# Patient Record
Sex: Female | Born: 1960 | Race: White | Hispanic: No | Marital: Single | Smoking: Never smoker
Health system: Southern US, Community
[De-identification: ages and names within clinical notes are randomized; demographics above are authoritative.]

## PROBLEM LIST (undated history)

## (undated) DIAGNOSIS — I10 Essential (primary) hypertension: Secondary | ICD-10-CM

## (undated) DIAGNOSIS — J302 Other seasonal allergic rhinitis: Secondary | ICD-10-CM

## (undated) HISTORY — PX: BACK SURGERY: SHX140

---

## 1998-10-14 ENCOUNTER — Encounter: Payer: Self-pay | Admitting: Neurological Surgery

## 1998-10-14 ENCOUNTER — Ambulatory Visit (HOSPITAL_COMMUNITY): Admission: RE | Admit: 1998-10-14 | Discharge: 1998-10-14 | Payer: Self-pay | Admitting: Neurological Surgery

## 1999-09-23 ENCOUNTER — Encounter: Admission: RE | Admit: 1999-09-23 | Discharge: 1999-10-14 | Payer: Self-pay | Admitting: Neurological Surgery

## 1999-10-30 ENCOUNTER — Emergency Department (HOSPITAL_COMMUNITY): Admission: EM | Admit: 1999-10-30 | Discharge: 1999-10-30 | Payer: Self-pay | Admitting: Emergency Medicine

## 1999-11-06 ENCOUNTER — Emergency Department (HOSPITAL_COMMUNITY): Admission: EM | Admit: 1999-11-06 | Discharge: 1999-11-06 | Payer: Self-pay | Admitting: Emergency Medicine

## 1999-11-06 ENCOUNTER — Encounter: Payer: Self-pay | Admitting: Emergency Medicine

## 2000-06-01 ENCOUNTER — Encounter: Admission: RE | Admit: 2000-06-01 | Discharge: 2000-06-01 | Payer: Self-pay | Admitting: Neurosurgery

## 2000-06-01 ENCOUNTER — Encounter: Payer: Self-pay | Admitting: Neurosurgery

## 2000-06-18 ENCOUNTER — Encounter: Admission: RE | Admit: 2000-06-18 | Discharge: 2000-06-18 | Payer: Self-pay | Admitting: Neurological Surgery

## 2000-06-18 ENCOUNTER — Encounter: Payer: Self-pay | Admitting: Neurological Surgery

## 2000-06-29 ENCOUNTER — Encounter: Payer: Self-pay | Admitting: Neurological Surgery

## 2000-06-29 ENCOUNTER — Encounter: Admission: RE | Admit: 2000-06-29 | Discharge: 2000-06-29 | Payer: Self-pay | Admitting: Neurological Surgery

## 2000-08-24 ENCOUNTER — Emergency Department (HOSPITAL_COMMUNITY): Admission: EM | Admit: 2000-08-24 | Discharge: 2000-08-24 | Payer: Self-pay | Admitting: Emergency Medicine

## 2000-08-24 ENCOUNTER — Encounter: Payer: Self-pay | Admitting: Emergency Medicine

## 2000-10-06 ENCOUNTER — Other Ambulatory Visit: Admission: RE | Admit: 2000-10-06 | Discharge: 2000-10-06 | Payer: Self-pay | Admitting: Obstetrics & Gynecology

## 2000-10-07 ENCOUNTER — Encounter: Payer: Self-pay | Admitting: Neurological Surgery

## 2000-10-11 ENCOUNTER — Inpatient Hospital Stay (HOSPITAL_COMMUNITY): Admission: RE | Admit: 2000-10-11 | Discharge: 2000-10-15 | Payer: Self-pay | Admitting: Neurological Surgery

## 2000-10-11 ENCOUNTER — Encounter: Payer: Self-pay | Admitting: Neurological Surgery

## 2000-10-21 ENCOUNTER — Ambulatory Visit (HOSPITAL_COMMUNITY): Admission: RE | Admit: 2000-10-21 | Discharge: 2000-10-21 | Payer: Self-pay | Admitting: Neurological Surgery

## 2000-12-15 ENCOUNTER — Encounter: Payer: Self-pay | Admitting: Neurological Surgery

## 2000-12-15 ENCOUNTER — Encounter: Admission: RE | Admit: 2000-12-15 | Discharge: 2000-12-15 | Payer: Self-pay | Admitting: Neurological Surgery

## 2001-04-15 ENCOUNTER — Encounter: Admission: RE | Admit: 2001-04-15 | Discharge: 2001-04-15 | Payer: Self-pay | Admitting: Neurological Surgery

## 2001-04-15 ENCOUNTER — Encounter: Payer: Self-pay | Admitting: Neurological Surgery

## 2006-11-24 ENCOUNTER — Emergency Department (HOSPITAL_COMMUNITY): Admission: EM | Admit: 2006-11-24 | Discharge: 2006-11-24 | Payer: Self-pay | Admitting: Emergency Medicine

## 2006-12-16 ENCOUNTER — Ambulatory Visit (HOSPITAL_COMMUNITY): Admission: RE | Admit: 2006-12-16 | Discharge: 2006-12-16 | Payer: Self-pay | Admitting: Urology

## 2010-03-21 ENCOUNTER — Ambulatory Visit: Payer: Self-pay | Admitting: Obstetrics & Gynecology

## 2010-03-27 ENCOUNTER — Ambulatory Visit: Payer: Self-pay | Admitting: Advanced Practice Midwife

## 2010-03-27 ENCOUNTER — Ambulatory Visit: Payer: Self-pay | Admitting: Obstetrics & Gynecology

## 2010-03-27 ENCOUNTER — Inpatient Hospital Stay (HOSPITAL_COMMUNITY): Admission: AD | Admit: 2010-03-27 | Discharge: 2010-03-27 | Payer: Self-pay | Admitting: Obstetrics and Gynecology

## 2010-03-28 ENCOUNTER — Ambulatory Visit: Payer: Self-pay | Admitting: Obstetrics & Gynecology

## 2010-03-31 ENCOUNTER — Ambulatory Visit: Payer: Self-pay | Admitting: Obstetrics & Gynecology

## 2010-04-03 ENCOUNTER — Ambulatory Visit: Payer: Self-pay | Admitting: Obstetrics & Gynecology

## 2010-04-08 ENCOUNTER — Ambulatory Visit: Payer: Self-pay | Admitting: Obstetrics & Gynecology

## 2010-04-10 ENCOUNTER — Ambulatory Visit: Payer: Self-pay | Admitting: Obstetrics & Gynecology

## 2010-04-17 ENCOUNTER — Ambulatory Visit (HOSPITAL_COMMUNITY): Admission: RE | Admit: 2010-04-17 | Discharge: 2010-04-17 | Payer: Self-pay | Admitting: Obstetrics & Gynecology

## 2010-04-17 ENCOUNTER — Ambulatory Visit: Payer: Self-pay | Admitting: Obstetrics and Gynecology

## 2010-04-21 ENCOUNTER — Ambulatory Visit: Payer: Self-pay | Admitting: Obstetrics & Gynecology

## 2010-04-24 ENCOUNTER — Encounter: Payer: Self-pay | Admitting: Obstetrics & Gynecology

## 2010-04-24 ENCOUNTER — Ambulatory Visit: Payer: Self-pay | Admitting: Family Medicine

## 2010-04-24 LAB — CONVERTED CEMR LAB
ALT: 9 units/L (ref 0–35)
AST: 12 units/L (ref 0–37)
BUN: 6 mg/dL (ref 6–23)
CO2: 21 meq/L (ref 19–32)
Calcium: 9.3 mg/dL (ref 8.4–10.5)
Creatinine, Ser: 0.55 mg/dL (ref 0.40–1.20)
Glucose, Bld: 75 mg/dL (ref 70–99)
Platelets: 151 10*3/uL (ref 150–400)
RDW: 13.4 % (ref 11.5–15.5)
Sodium: 137 meq/L (ref 135–145)
Total Protein: 6.3 g/dL (ref 6.0–8.3)

## 2010-04-25 ENCOUNTER — Encounter: Payer: Self-pay | Admitting: Obstetrics & Gynecology

## 2010-04-25 LAB — CONVERTED CEMR LAB
Collection Interval-CRCL: 24 hr
Creatinine 24 HR UR: 1189 mg/24hr (ref 700–1800)

## 2010-04-28 ENCOUNTER — Ambulatory Visit: Payer: Self-pay | Admitting: Obstetrics & Gynecology

## 2010-05-01 ENCOUNTER — Encounter (INDEPENDENT_AMBULATORY_CARE_PROVIDER_SITE_OTHER): Payer: Self-pay | Admitting: *Deleted

## 2010-05-01 ENCOUNTER — Ambulatory Visit: Payer: Self-pay | Admitting: Obstetrics & Gynecology

## 2010-05-01 LAB — CONVERTED CEMR LAB
AST: 13 units/L (ref 0–37)
BUN: 4 mg/dL — ABNORMAL LOW (ref 6–23)
Chlamydia, DNA Probe: NEGATIVE
Chloride: 104 meq/L (ref 96–112)
GC Probe Amp, Genital: NEGATIVE
HCT: 39.2 % (ref 36.0–46.0)
MCHC: 33.2 g/dL (ref 30.0–36.0)
Platelets: 163 10*3/uL (ref 150–400)
Potassium: 3.9 meq/L (ref 3.5–5.3)
RBC: 4.06 M/uL (ref 3.87–5.11)
RDW: 13.5 % (ref 11.5–15.5)
Total Bilirubin: 1 mg/dL (ref 0.3–1.2)

## 2010-05-02 ENCOUNTER — Encounter: Payer: Self-pay | Admitting: Obstetrics & Gynecology

## 2010-05-02 ENCOUNTER — Ambulatory Visit: Payer: Self-pay | Admitting: Obstetrics & Gynecology

## 2010-05-02 ENCOUNTER — Encounter (INDEPENDENT_AMBULATORY_CARE_PROVIDER_SITE_OTHER): Payer: Self-pay | Admitting: *Deleted

## 2010-05-02 LAB — CONVERTED CEMR LAB
Collection Interval-CRCL: 24 hr
Creatinine, Urine: 65.5 mg/dL

## 2010-05-05 ENCOUNTER — Ambulatory Visit: Payer: Self-pay | Admitting: Family Medicine

## 2010-05-08 ENCOUNTER — Ambulatory Visit: Payer: Self-pay | Admitting: Family Medicine

## 2010-05-08 ENCOUNTER — Inpatient Hospital Stay (HOSPITAL_COMMUNITY): Admission: RE | Admit: 2010-05-08 | Discharge: 2010-05-12 | Payer: Self-pay | Admitting: Family Medicine

## 2010-05-10 ENCOUNTER — Encounter: Payer: Self-pay | Admitting: Obstetrics & Gynecology

## 2010-05-16 ENCOUNTER — Ambulatory Visit: Payer: Self-pay | Admitting: Obstetrics and Gynecology

## 2010-07-24 ENCOUNTER — Ambulatory Visit: Payer: Self-pay | Admitting: Obstetrics & Gynecology

## 2010-08-22 ENCOUNTER — Ambulatory Visit (HOSPITAL_COMMUNITY): Admission: RE | Admit: 2010-08-22 | Discharge: 2010-08-22 | Payer: Self-pay | Admitting: Family Medicine

## 2010-09-25 ENCOUNTER — Other Ambulatory Visit: Admission: RE | Admit: 2010-09-25 | Discharge: 2010-09-25 | Payer: Self-pay | Admitting: Obstetrics & Gynecology

## 2010-09-25 ENCOUNTER — Ambulatory Visit: Payer: Self-pay | Admitting: Obstetrics and Gynecology

## 2010-10-20 ENCOUNTER — Ambulatory Visit: Payer: Self-pay | Admitting: Obstetrics and Gynecology

## 2010-11-20 ENCOUNTER — Inpatient Hospital Stay (HOSPITAL_COMMUNITY): Admission: AD | Admit: 2010-11-20 | Discharge: 2010-03-22 | Payer: Self-pay | Admitting: Family Medicine

## 2010-12-08 IMAGING — US US OB FOLLOW-UP
1 series · 14 of 28 positions shown · non-contrast
Comparison: none

OBSTETRICAL ULTRASOUND:
 This ultrasound exam was performed in the [HOSPITAL] Ultrasound Department.  The OB US report was generated in the AS system, and faxed to the ordering physician.  This report is also available in [HOSPITAL]?s AccessANYware and in [REDACTED] PACS.

[Series 1: us ob follow up · 0.27mm/px · 44 acquisitions, 14 frames shown]
[im 2/44]
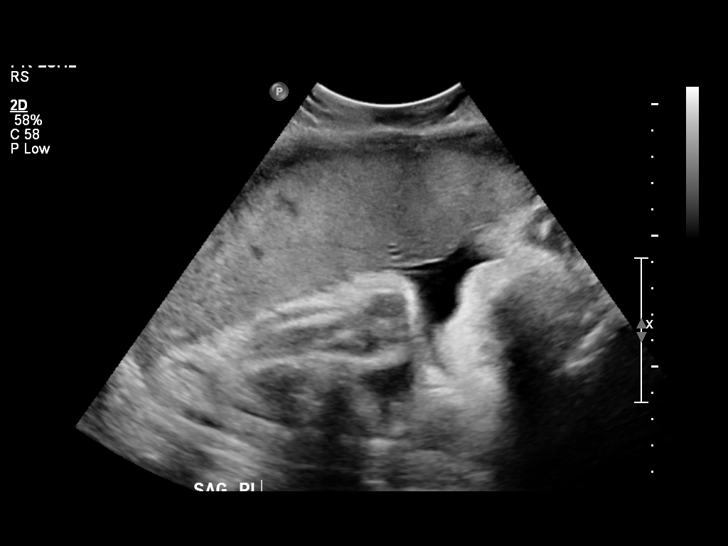
[im 5/44]
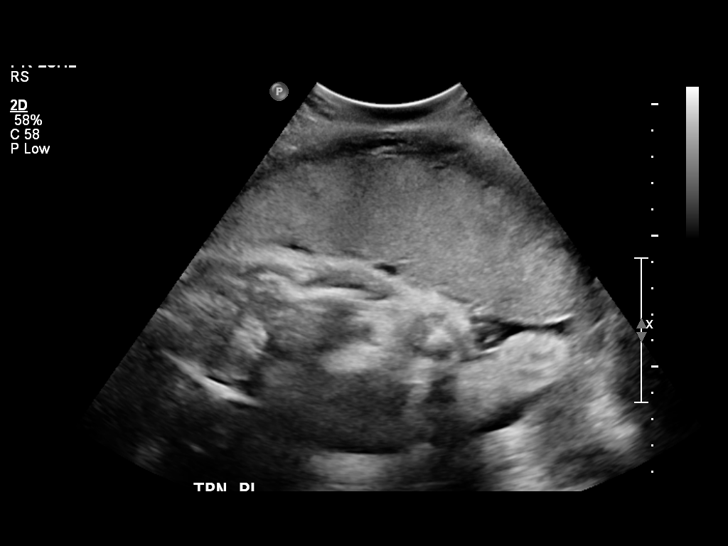
[im 8/44]
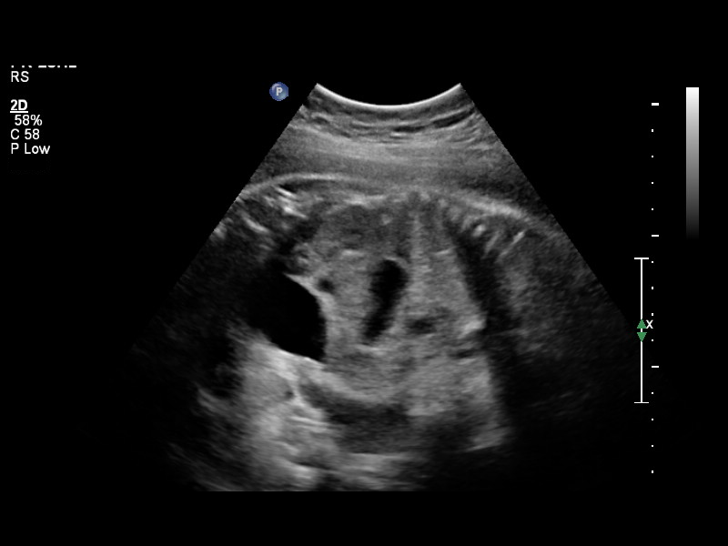
[im 12/44]
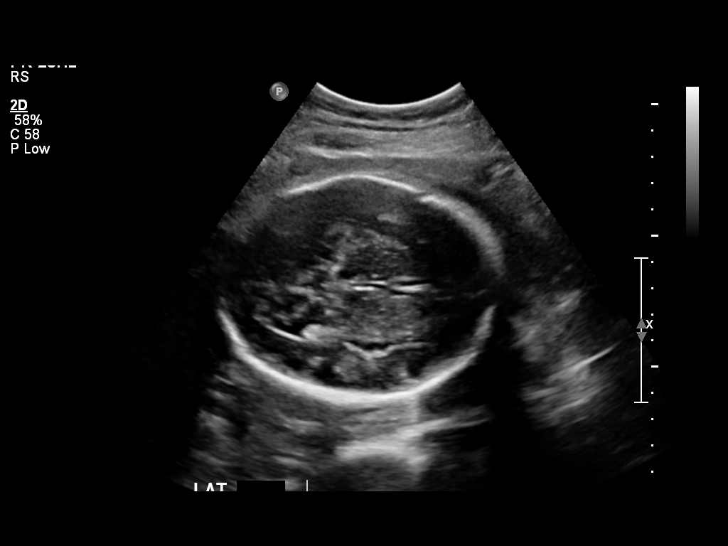
[im 15/44]
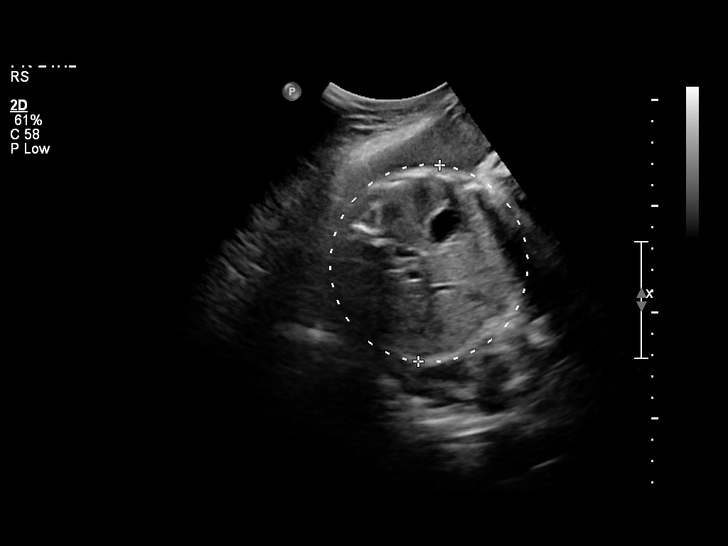
[im 18/44]
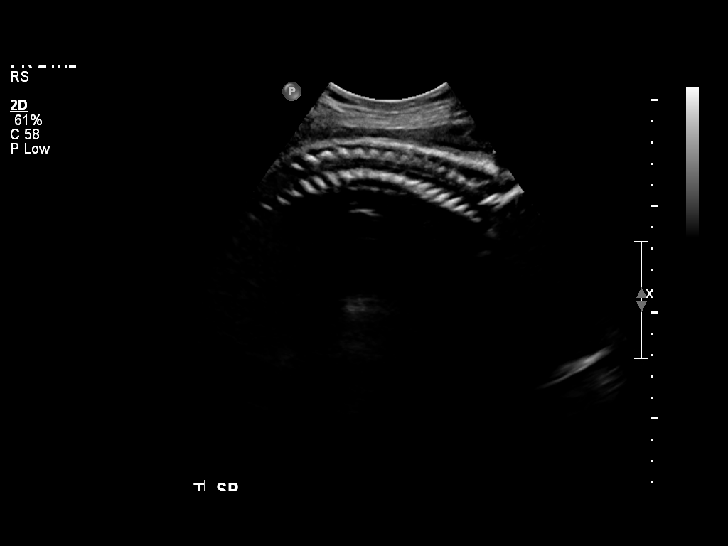
[im 21/44]
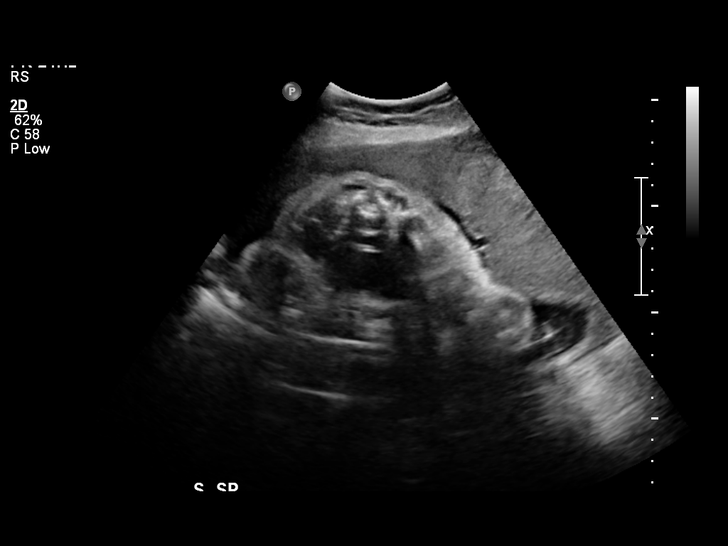
[im 24/44]
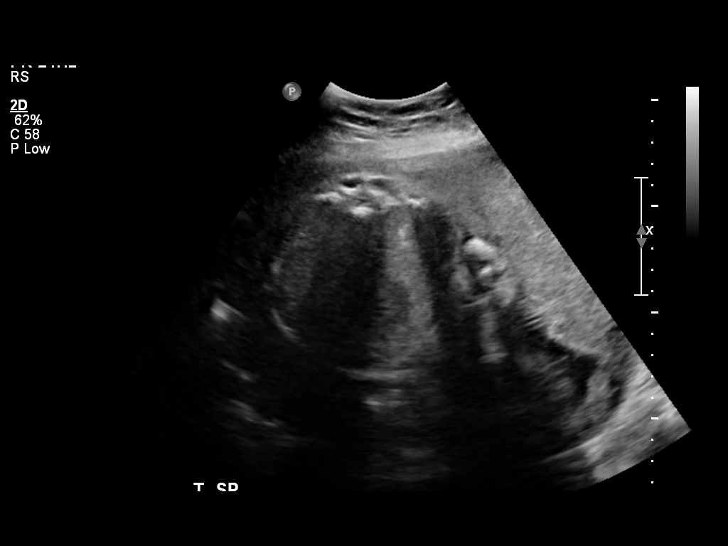
[im 28/44]
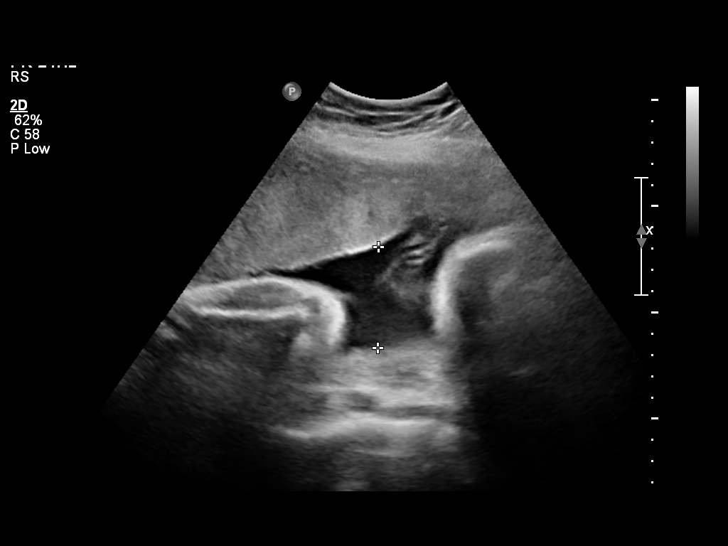
[im 31/44]
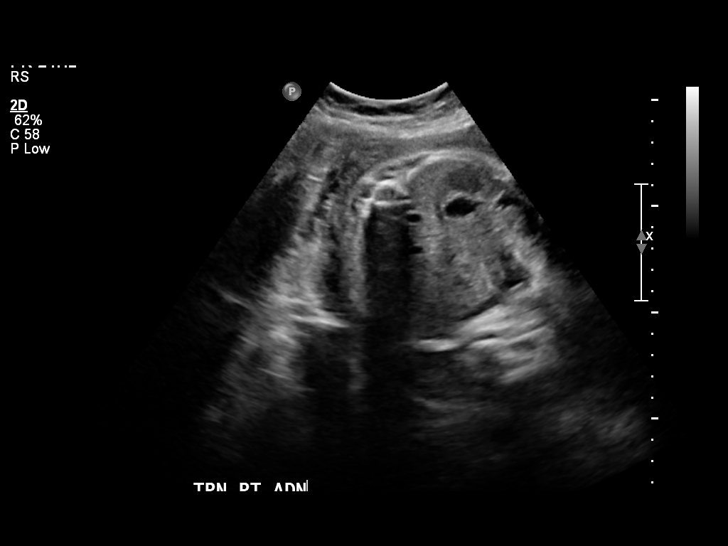
[im 34/44]
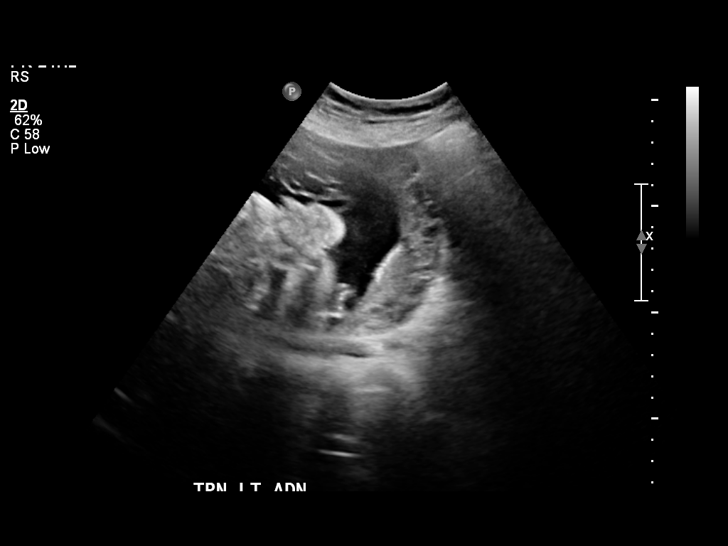
[im 37/44]
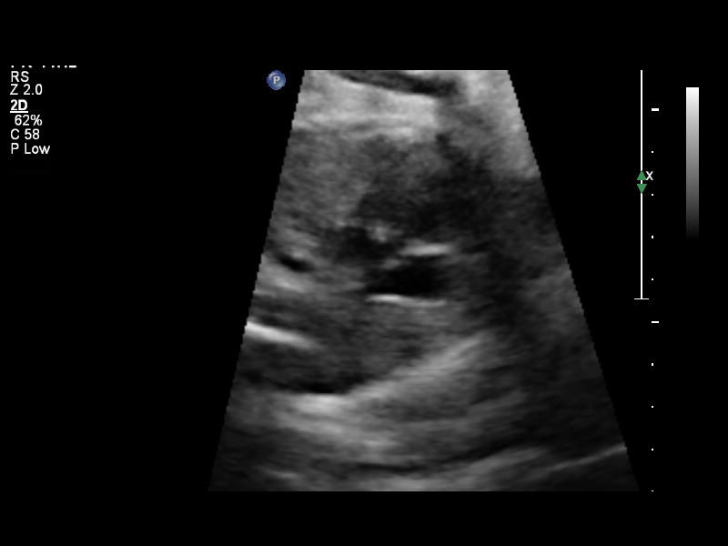
[im 40/44]
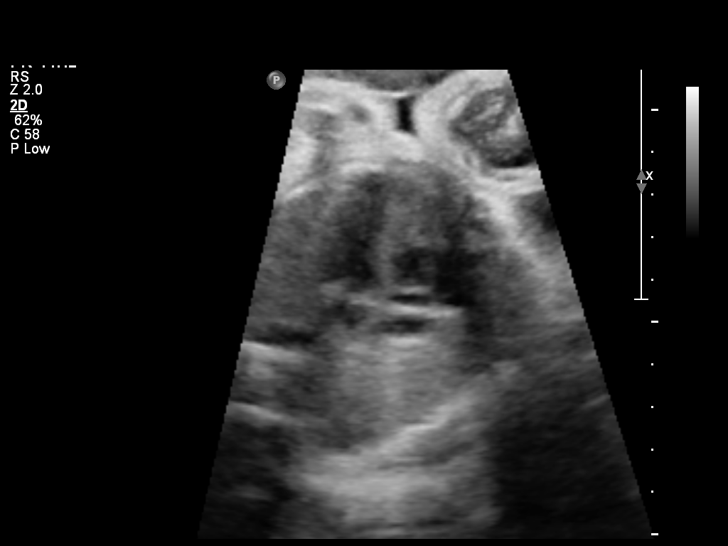
[im 44/44]
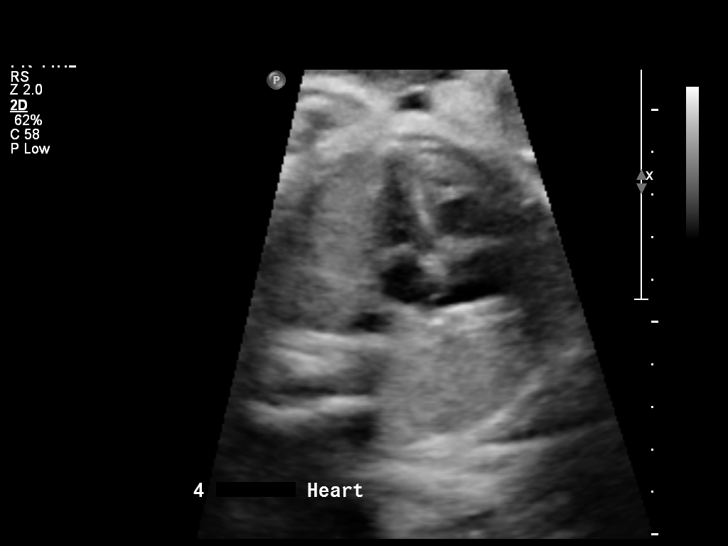

[14 of 28 positions shown; findings below may reference images not displayed]

IMPRESSION: See AS Obstetric US report.

## 2011-03-02 LAB — POCT URINALYSIS DIP (DEVICE)
Hgb urine dipstick: NEGATIVE
Ketones, ur: NEGATIVE mg/dL
Nitrite: NEGATIVE
Protein, ur: 30 mg/dL — AB
Specific Gravity, Urine: 1.01 (ref 1.005–1.030)
Specific Gravity, Urine: 1.02 (ref 1.005–1.030)
Urobilinogen, UA: 0.2 mg/dL (ref 0.0–1.0)
pH: 5.5 (ref 5.0–8.0)

## 2011-03-02 LAB — CBC
HCT: 31.3 % — ABNORMAL LOW (ref 36.0–46.0)
HCT: 36.1 % (ref 36.0–46.0)
Hemoglobin: 11 g/dL — ABNORMAL LOW (ref 12.0–15.0)
Hemoglobin: 12.7 g/dL (ref 12.0–15.0)
MCHC: 35 g/dL (ref 30.0–36.0)
MCV: 100.5 fL — ABNORMAL HIGH (ref 78.0–100.0)
Platelets: 102 10*3/uL — ABNORMAL LOW (ref 150–400)
RBC: 3.11 MIL/uL — ABNORMAL LOW (ref 3.87–5.11)
RBC: 3.23 MIL/uL — ABNORMAL LOW (ref 3.87–5.11)
RBC: 3.66 MIL/uL — ABNORMAL LOW (ref 3.87–5.11)
RDW: 13.3 % (ref 11.5–15.5)
WBC: 8.9 10*3/uL (ref 4.0–10.5)
WBC: 9.8 10*3/uL (ref 4.0–10.5)

## 2011-03-02 LAB — COMPREHENSIVE METABOLIC PANEL
ALT: 10 U/L (ref 0–35)
ALT: 9 U/L (ref 0–35)
AST: 17 U/L (ref 0–37)
AST: 18 U/L (ref 0–37)
Albumin: 2.9 g/dL — ABNORMAL LOW (ref 3.5–5.2)
BUN: 3 mg/dL — ABNORMAL LOW (ref 6–23)
BUN: 5 mg/dL — ABNORMAL LOW (ref 6–23)
CO2: 22 mEq/L (ref 19–32)
CO2: 24 mEq/L (ref 19–32)
Calcium: 9.4 mg/dL (ref 8.4–10.5)
Chloride: 112 mEq/L (ref 96–112)
GFR calc non Af Amer: >60 mL/min (ref >60)
Glucose, Bld: 72 mg/dL (ref 70–99)
Glucose, Bld: 84 mg/dL (ref 70–99)
Potassium: 3.3 mEq/L — ABNORMAL LOW (ref 3.5–5.1)
Potassium: 3.9 mEq/L (ref 3.5–5.1)
Total Bilirubin: 0.8 mg/dL (ref 0.3–1.2)
Total Protein: 5.1 g/dL — ABNORMAL LOW (ref 6.0–8.3)

## 2011-03-03 LAB — POCT URINALYSIS DIP (DEVICE)
Bilirubin Urine: NEGATIVE
Bilirubin Urine: NEGATIVE
Bilirubin Urine: NEGATIVE
Glucose, UA: NEGATIVE mg/dL
Glucose, UA: NEGATIVE mg/dL
Hgb urine dipstick: NEGATIVE
Hgb urine dipstick: NEGATIVE
Ketones, ur: NEGATIVE mg/dL
Ketones, ur: NEGATIVE mg/dL
Nitrite: NEGATIVE
Nitrite: NEGATIVE
Protein, ur: 30 mg/dL — AB
Protein, ur: 30 mg/dL — AB
Specific Gravity, Urine: >=1.03 (ref 1.005–1.030)
Specific Gravity, Urine: 1.015 (ref 1.005–1.030)
Specific Gravity, Urine: >=1.03 (ref 1.005–1.030)
Urobilinogen, UA: 0.2 mg/dL (ref 0.0–1.0)
Urobilinogen, UA: 0.2 mg/dL (ref 0.0–1.0)
pH: 5.5 (ref 5.0–8.0)
pH: 5.5 (ref 5.0–8.0)

## 2011-03-04 LAB — PROTEIN, URINE, 24 HOUR
Collection Interval-UPROT: 24 hours
Protein, 24H Urine: 107 mg/d — ABNORMAL HIGH (ref 50–100)
Urine Total Volume-UPROT: 3550 mL

## 2011-03-04 LAB — COMPREHENSIVE METABOLIC PANEL
ALT: 12 U/L (ref 0–35)
Albumin: 2.8 g/dL — ABNORMAL LOW (ref 3.5–5.2)
Alkaline Phosphatase: 88 U/L (ref 39–117)
Alkaline Phosphatase: 93 U/L (ref 39–117)
BUN: 1 mg/dL — ABNORMAL LOW (ref 6–23)
BUN: 3 mg/dL — ABNORMAL LOW (ref 6–23)
CO2: 25 mEq/L (ref 19–32)
Calcium: 8.3 mg/dL — ABNORMAL LOW (ref 8.4–10.5)
Chloride: 101 mEq/L (ref 96–112)
GFR calc non Af Amer: >60 mL/min (ref >60)
Glucose, Bld: 90 mg/dL (ref 70–99)
Potassium: 2.3 mEq/L — CL (ref 3.5–5.1)
Potassium: 2.9 mEq/L — ABNORMAL LOW (ref 3.5–5.1)
Sodium: 134 mEq/L — ABNORMAL LOW (ref 135–145)
Total Bilirubin: 0.7 mg/dL (ref 0.3–1.2)
Total Protein: 5.9 g/dL — ABNORMAL LOW (ref 6.0–8.3)

## 2011-03-04 LAB — RAPID URINE DRUG SCREEN, HOSP PERFORMED
Amphetamines: NOT DETECTED
Barbiturates: NOT DETECTED
Benzodiazepines: NOT DETECTED

## 2011-03-04 LAB — CBC
HCT: 33.2 % — ABNORMAL LOW (ref 36.0–46.0)
HCT: 34.5 % — ABNORMAL LOW (ref 36.0–46.0)
Hemoglobin: 11.5 g/dL — ABNORMAL LOW (ref 12.0–15.0)
MCHC: 34.3 g/dL (ref 30.0–36.0)
Platelets: 177 10*3/uL (ref 150–400)
RBC: 3.36 MIL/uL — ABNORMAL LOW (ref 3.87–5.11)
RDW: 13.8 % (ref 11.5–15.5)
RDW: 13.8 % (ref 11.5–15.5)

## 2011-03-04 LAB — GC/CHLAMYDIA PROBE AMP, GENITAL
Chlamydia, DNA Probe: NEGATIVE
GC Probe Amp, Genital: NEGATIVE

## 2011-03-04 LAB — STREP B DNA PROBE: Strep Group B Ag: NEGATIVE

## 2011-03-04 LAB — BASIC METABOLIC PANEL
BUN: 1 mg/dL — ABNORMAL LOW (ref 6–23)
CO2: 23 mEq/L (ref 19–32)
Calcium: 9 mg/dL (ref 8.4–10.5)
GFR calc non Af Amer: >60 mL/min (ref >60)
Glucose, Bld: 125 mg/dL — ABNORMAL HIGH (ref 70–99)

## 2011-03-04 LAB — URIC ACID
Uric Acid, Serum: 4.7 mg/dL (ref 2.4–7.0)
Uric Acid, Serum: 4.7 mg/dL (ref 2.4–7.0)

## 2011-03-04 LAB — DIFFERENTIAL
Basophils Relative: 0 % (ref 0–1)
Lymphocytes Relative: 14 % (ref 12–46)
Lymphs Abs: 0.6 10*3/uL — ABNORMAL LOW (ref 0.7–4.0)
Monocytes Absolute: 0.3 10*3/uL (ref 0.1–1.0)
Monocytes Relative: 7 % (ref 3–12)
Neutro Abs: 3.4 10*3/uL (ref 1.7–7.7)

## 2011-03-04 LAB — RUBELLA SCREEN: Rubella: 127.1 IU/mL — ABNORMAL HIGH

## 2011-03-04 LAB — POCT URINALYSIS DIP (DEVICE)
Bilirubin Urine: NEGATIVE
Glucose, UA: NEGATIVE mg/dL
Ketones, ur: NEGATIVE mg/dL
Nitrite: NEGATIVE
pH: 5.5 (ref 5.0–8.0)

## 2011-03-04 LAB — URINALYSIS, ROUTINE W REFLEX MICROSCOPIC
Hgb urine dipstick: NEGATIVE
Nitrite: NEGATIVE
Protein, ur: NEGATIVE mg/dL
pH: 5.5 (ref 5.0–8.0)

## 2011-03-04 LAB — WET PREP, GENITAL

## 2011-03-04 LAB — CREATININE CLEARANCE, URINE, 24 HOUR
Creatinine Clearance: 163 mL/min — ABNORMAL HIGH (ref 75–115)
Creatinine, Urine: 26.4 mg/dL

## 2011-03-04 LAB — HIV ANTIBODY (ROUTINE TESTING W REFLEX): HIV: NONREACTIVE

## 2011-03-04 LAB — TYPE AND SCREEN
ABO/RH(D): B POS
Antibody Screen: NEGATIVE

## 2011-03-04 LAB — RPR: RPR Ser Ql: NONREACTIVE

## 2011-04-22 ENCOUNTER — Ambulatory Visit: Payer: Self-pay | Admitting: Family Medicine

## 2011-05-01 NOTE — Op Note (Signed)
[place]  Patient:    Kimberly Bush, Kimberly Bush                           MRN: [id number] Proc. Date: 10/11/00 Adm. 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  PROCEDURE:  L5 Gill procedure, posterior interbody arthrodesis with Brantigan cages and local autograft, pedicular fixation L5-S1 with Spiral 90 instrumentation and posterolateral arthrodesis with autograft and allograft.  SURGEON:  Stefani Dama, M.D.  FIRST ASSISTANT:  Tanya Nones. Jeral Fruit, M.D.  ANESTHESIA:  General endotracheal.  INDICATIONS:  The patient is a 50 year old individual who has had significant back and bilateral leg pain.  She has had a grade 1 spondylolisthesis that progressed to a grade 2 spondylolisthesis, and she was advised regarding surgery.  DESCRIPTION OF PROCEDURE:  The patient was brought to the operating room supine on the stretcher.  She was turned prone after general endotracheal anesthesia was started, and the bony prominences were appropriately padded and protected.  The back was then shaved, prepped with Duraprep, and draped in a sterile fashion.  A vertical incision was made in the lower portion of the lumbar spine and taken down to the spinous process of the sacrum, L5, and L4 regions.  Dissection was carried out laterally.  The loose L5 laminar arch was identified, and this was then dissected out to the lateral margins, freeing up the facet joints and removing the spondylolisthetic segments as a single piece.  Then the lateral recesses were decompressed, completing the Gill procedure portion, which decompressed the L5 and the S1 nerve roots bilaterally.  The disk space was identified.  The overhang from the spondylolisthesis was also  identified, and an osteotome was used to remove this overhang and enter the disk space widely from both sides.  The disk was noted to be markedly degenerated and with removal of the disk, the space was noted to be somewhat distractable.  An interbody distractor was then placed between the arch of L4 and the arch of the sacrum, and this allowed for opening of the L5-S1 disk space.  Then by using a combination of curettes and rongeurs, the disk space was evacuated totally of its contents such that the anterior longitudinal ligament was identified ventrally.  Brantigan cages were then sized, and the 11 x 11 x 25 mm cage was felt to fit appropriately.  The cage was then tamped into position after being packed with local autograft. Care was taken to protect the L5 and the S1 nerve roots during this entire procedure.  Pedicle entry sites were then chosen after the intertransverse space was cleared at L5-S1.  Pedicle entry sites were chosen at L5.  The lateral edge of the L4-5 facet needed to be trimmed on either side to allow pedicle entry.  Screws 5.75 x 40 mm were placed into the pedicle of L5, 5.75 x 35 mm screws were placed into the pedicle of the sacrum, with the ventral cortex being pierced to allow anchoring of the screw.  Then two 40 mm rods were contoured between these screws and placed down and locked into position. The lateral intertransverse space was then packed with a  combination of autograft and allograft.  The allograft used was _____ bone expander matrix. Once this was completed, the patency of the common dural tube, the L5 nerve roots, and the S1 nerve roots was checked.  Hemostasis in the soft tissues was checked.  Then the lumbodorsal fascia was reapproximated with #1 Vicryl in interrupted fashion, 2-0 Vicryl was used subcutaneously, and subcuticularly 3-0 Vicryl was used to close the subcuticular layer.  The patient tolerated the procedure well and was returned to the recovery  room in stable condition. D:  10/11/00 TD:  10/12/00 Job: [id number]

## 2011-05-01 NOTE — Discharge Summary (Signed)
[place]. [facility]  Patient:    Kimberly Bush, Kimberly Bush                           MRN: [id number] Adm. Date:  [id number] Disc. Date: [id number] Attending:  Jonne Ply                           Discharge Summary  ADMITTING DIAGNOSIS:  L5-S1 spondylolithesis grade 2 with lumbar radiculopathy at L5.  DISCHARGE DIAGNOSIS:  L5-S1 spondylolithesis grade 2 with lumbar radiculopathy at L5.  OPERATION:  Lumbar Gill procedure at L5, posterior interbody arthrodesis L5-S1, supplemental fixation with pedicle screws L5-S1, posterolateral grafting with autograft and allograft on October 11, 2000.  CONDITION ON DISCHARGE:  Improved.  HOSPITAL COURSE:  The patient is a 50 year old individual who has had significant back and bilateral leg pain.  This has been progressively worseniNG despite conservative measures.  She was advised regarding surgical decompression and stabilization which was performed on October 29.  The patients incision has remained clean and dry, and she has been gradually mobilized.  She has had some difficulty with independence of activity but has been improving steadily.  She will be home under the care of her mom and is using oral pain medication in the form of Percocet and is given a prescription for #60, these without refills, Xanax 0.5 mg #40 without refill.  She will be seen in the office in two weeks time.  POSTOPERATIVE LABORATORY STUDIES:  Within normal limits. DD:  10/15/00 TD:  10/15/00 Job: [id number]

## 2011-05-14 ENCOUNTER — Other Ambulatory Visit: Payer: Self-pay | Admitting: Obstetrics and Gynecology

## 2011-05-14 ENCOUNTER — Ambulatory Visit (INDEPENDENT_AMBULATORY_CARE_PROVIDER_SITE_OTHER): Payer: Self-pay | Admitting: Obstetrics and Gynecology

## 2011-05-14 DIAGNOSIS — R87619 Unspecified abnormal cytological findings in specimens from cervix uteri: Secondary | ICD-10-CM

## 2014-06-12 ENCOUNTER — Emergency Department (HOSPITAL_COMMUNITY)
Admission: EM | Admit: 2014-06-12 | Discharge: 2014-06-12 | Disposition: A | Discharge status: Home / Self Care | Payer: Self-pay | Attending: Emergency Medicine | Admitting: Emergency Medicine

## 2014-06-12 ENCOUNTER — Encounter (HOSPITAL_COMMUNITY): Payer: Self-pay | Admitting: Emergency Medicine

## 2014-06-12 DIAGNOSIS — R5381 Other malaise: Secondary | ICD-10-CM | POA: Insufficient documentation

## 2014-06-12 DIAGNOSIS — R5383 Other fatigue: Secondary | ICD-10-CM

## 2014-06-12 DIAGNOSIS — Z792 Long term (current) use of antibiotics: Secondary | ICD-10-CM | POA: Insufficient documentation

## 2014-06-12 DIAGNOSIS — N390 Urinary tract infection, site not specified: Secondary | ICD-10-CM | POA: Insufficient documentation

## 2014-06-12 DIAGNOSIS — E876 Hypokalemia: Secondary | ICD-10-CM | POA: Insufficient documentation

## 2014-06-12 DIAGNOSIS — N898 Other specified noninflammatory disorders of vagina: Secondary | ICD-10-CM | POA: Insufficient documentation

## 2014-06-12 DIAGNOSIS — Z79899 Other long term (current) drug therapy: Secondary | ICD-10-CM | POA: Insufficient documentation

## 2014-06-12 DIAGNOSIS — R6883 Chills (without fever): Secondary | ICD-10-CM | POA: Insufficient documentation

## 2014-06-12 LAB — URINALYSIS, ROUTINE W REFLEX MICROSCOPIC
GLUCOSE, UA: NEGATIVE mg/dL
Ketones, ur: 15 mg/dL — AB
Nitrite: NEGATIVE
Protein, ur: 100 mg/dL — AB
SPECIFIC GRAVITY, URINE: 1.028 (ref 1.005–1.030)
Urobilinogen, UA: 1 mg/dL (ref 0.0–1.0)
pH: 5.5 (ref 5.0–8.0)

## 2014-06-12 LAB — I-STAT CHEM 8, ED
BUN: 7 mg/dL (ref 6–23)
CHLORIDE: 98 meq/L (ref 96–112)
Calcium, Ion: 1.09 mmol/L — ABNORMAL LOW (ref 1.12–1.23)
Creatinine, Ser: 0.6 mg/dL (ref 0.50–1.10)
GLUCOSE: 146 mg/dL — AB (ref 70–99)
HCT: 40 % (ref 36.0–46.0)
HEMOGLOBIN: 13.6 g/dL (ref 12.0–15.0)
Potassium: 2.5 mEq/L — CL (ref 3.7–5.3)
Sodium: 135 mEq/L — ABNORMAL LOW (ref 137–147)
TCO2: 27 mmol/L (ref 0–100)

## 2014-06-12 LAB — URINE MICROSCOPIC-ADD ON

## 2014-06-12 LAB — WET PREP, GENITAL
CLUE CELLS WET PREP: NONE SEEN
TRICH WET PREP: NONE SEEN
YEAST WET PREP: NONE SEEN

## 2014-06-12 MED ORDER — CEPHALEXIN 500 MG PO CAPS: 500.0000 mg | ORAL_CAPSULE | Freq: Three times a day (TID) | ORAL | Status: AC

## 2014-06-12 MED ORDER — POTASSIUM CHLORIDE CRYS ER 20 MEQ PO TBCR: 20.0000 meq | EXTENDED_RELEASE_TABLET | Freq: Two times a day (BID) | ORAL | Status: DC

## 2014-06-12 MED ORDER — CEPHALEXIN 250 MG PO CAPS
500.0000 mg | ORAL_CAPSULE | Freq: Once | ORAL | Status: AC
  Administered 2014-06-12: 500 mg via ORAL
  Filled 2014-06-12: qty 2

## 2014-06-12 NOTE — ED Notes (Signed)
Pt presents to department for evaluation of vaginal discharge, lower back pain, chills and generalized weakness. Ongoing x5 days. States she is sexually active with x1 partner. Also states dysuria. Pt is alert and oriented x4. NAD.

## 2014-06-12 NOTE — ED Provider Notes (Signed)
CSN: [id number]     Arrival date & time 06/12/14  1108 History   None    Chief Complaint  Patient presents with  . Vaginal Discharge  . Back Pain  . Generalized Body Aches  . Chills     (Consider location/radiation/quality/duration/timing/severity/associated sxs/prior Treatment) Patient is a 53 y.o. female presenting with vaginal discharge.  Vaginal Discharge Quality:  Yellow and thick Severity:  Mild Onset quality:  Gradual Duration:  4 days Timing:  Constant Chronicity:  New Relieved by:  Nothing Ineffective treatments: OTC anti fungal. Associated symptoms: dysuria   Associated symptoms: no fever   Risk factors: unprotected sex   Risk factors: no foreign body, no new sexual partner, no PID, no STI and no STI exposure     History reviewed. No pertinent past medical history. History reviewed. No pertinent past surgical history. No family history on file. History  Substance Use Topics  . Smoking status: Never Smoker   . Smokeless tobacco: Not on file  . Alcohol Use: Yes     Comment: social   OB History   Grav Para Term Preterm Abortions TAB SAB Ect Mult Living                 Review of Systems  Constitutional: Positive for chills and fatigue. Negative for fever.  HENT: Negative for congestion, postnasal drip and rhinorrhea.   Eyes: Negative for photophobia and pain.  Respiratory: Negative for cough and shortness of breath.   Cardiovascular: Negative for chest pain.  Endocrine: Negative for polydipsia and polyuria.  Genitourinary: Positive for dysuria and vaginal discharge. Negative for flank pain and difficulty urinating.  Musculoskeletal: Positive for back pain. Negative for myalgias and neck stiffness.  Skin: Negative for color change and rash.  Neurological: Negative for dizziness and headaches.  Hematological: Negative for adenopathy.  Psychiatric/Behavioral: Negative for confusion.      Allergies  Prednisone  Home Medications   Prior to Admission  medications   Medication Sig Start Date End Date Taking? Authorizing Zong Mcquarrie  Acetaminophen (TYLENOL PO) Take 2 tablets by mouth daily as needed (for headache).   Yes Historical Domingos Riggi, MD  Miconazole Nitrate (MONISTAT 7 VA) Place 1 each vaginally daily at 6 PM. 06/08/14 06/14/14 Yes Historical Khushbu Pippen, MD  cephALEXin (KEFLEX) 500 MG capsule Take 1 capsule (500 mg total) by mouth 3 (three) times daily. 06/12/14 06/19/14  Marily MemosJason Mesner, MD  potassium chloride SA (K-DUR,KLOR-CON) 20 MEQ tablet Take 1 tablet (20 mEq total) by mouth 2 (two) times daily. 06/12/14 06/19/14  Barbara CowerJason Mesner, MD   BP 130/69  Pulse 85  Temp(Src) 98.2 F (36.8 C) (Oral)  Resp 16  SpO2 95% Physical Exam  Nursing note and vitals reviewed. Constitutional: She is oriented to person, place, and time. She appears well-developed and well-nourished.  HENT:  Head: Normocephalic and atraumatic.  Eyes: Conjunctivae and EOM are normal. Right eye exhibits no discharge. Left eye exhibits no discharge.  Cardiovascular: Normal rate and regular rhythm.   Pulmonary/Chest: Effort normal and breath sounds normal. No respiratory distress.  Abdominal: Soft. She exhibits no distension. There is tenderness (suprapubic ). There is no rebound.  Musculoskeletal: Normal range of motion. She exhibits no edema and no tenderness.  Neurological: She is alert and oriented to person, place, and time.  Skin: Skin is warm and dry.    ED Course  Procedures (including critical care time) Labs Review Labs Reviewed  WET PREP, GENITAL - Abnormal; Notable for the following:    WBC, Wet  Prep HPF POC MODERATE (*)    All other components within normal limits  URINALYSIS, ROUTINE W REFLEX MICROSCOPIC - Abnormal; Notable for the following:    Color, Urine AMBER (*)    APPearance CLOUDY (*)    Hgb urine dipstick LARGE (*)    Bilirubin Urine SMALL (*)    Ketones, ur 15 (*)    Protein, ur 100 (*)    Leukocytes, UA MODERATE (*)    All other components within  normal limits  URINE MICROSCOPIC-ADD ON - Abnormal; Notable for the following:    Squamous Epithelial / LPF FEW (*)    Bacteria, UA MANY (*)    All other components within normal limits  I-STAT CHEM 8, ED - Abnormal; Notable for the following:    Sodium 135 (*)    Potassium 2.5 (*)    Glucose, Bld 146 (*)    Calcium, Ion 1.09 (*)    All other components within normal limits  GC/CHLAMYDIA PROBE AMP    Imaging Review No results found.   EKG Interpretation None      MDM   Final diagnoses:  UTI (lower urinary tract infection)  Hypokalemia    53 yo F w/ h/o back surgery here with a few days of malaise, back pain, weakness, muscle aches and chills and yellow thick vaginal discharge. Has been using anti fungal treatments from the drug store without relief. Thinks back pain may be related to helping someone move this weekend. Sexually active with one person for last 5.5 years and feels they are faithful to her. On exam, no cva ttp, mild suprapubic ttp, no distended bladder, pelvic exam with large thin yellow discharge without odor, mild cervicitis.  Will get urine sample and chem 8 to eval for cystitis and pyelonephritis although she appears pretty comfortable without any nausea or fevers, so is unlikely.  Will get cultures and wet prep to eval for std's. Pt certain no exposure possible, so will forego ppx treatment for now and treat if trich or vaginosis noticeable.   Urine with UTI. Will tx with keflex.  No yeast/trich/bv on wet prep.  Chem 8 with low potassium, rhythm strip normal. Pt asymptomatic from that stand point. Will have her take a weeks worth of po K and fu repeat K with pcp.  D/c home and to fu w/ pcp in one week  Marily MemosJason Mesner, MD 06/13/14 0145

## 2014-06-13 LAB — GC/CHLAMYDIA PROBE AMP
CT PROBE, AMP APTIMA: NEGATIVE
GC Probe RNA: NEGATIVE

## 2014-06-13 NOTE — ED Provider Notes (Signed)
Medical screening examination/treatment/procedure(s) were conducted as a shared visit with resident physician and myself.  I personally evaluated the patient during the encounter.   I interviewed and examined the patient. Lungs are CTAB. Cardiac exam wnl. Abdomen soft.  Found to have UTI. Resident performed the pelvic. Will tx w/ Abx and also Potassium d/t low K. Rec f/u w /pcp.   Junius ArgyleForrest S Harrison, MD 06/13/14 [phone]

## 2018-07-01 ENCOUNTER — Ambulatory Visit (HOSPITAL_COMMUNITY)
Admission: EM | Admit: 2018-07-01 | Discharge: 2018-07-01 | Disposition: A | Discharge status: Home / Self Care | Payer: Self-pay | Attending: Internal Medicine | Admitting: Internal Medicine

## 2018-07-01 ENCOUNTER — Encounter (HOSPITAL_COMMUNITY): Payer: Self-pay

## 2018-07-01 DIAGNOSIS — R1110 Vomiting, unspecified: Secondary | ICD-10-CM

## 2018-07-01 DIAGNOSIS — Z888 Allergy status to other drugs, medicaments and biological substances status: Secondary | ICD-10-CM | POA: Insufficient documentation

## 2018-07-01 DIAGNOSIS — R51 Headache: Secondary | ICD-10-CM

## 2018-07-01 DIAGNOSIS — R109 Unspecified abdominal pain: Secondary | ICD-10-CM

## 2018-07-01 DIAGNOSIS — Z79899 Other long term (current) drug therapy: Secondary | ICD-10-CM | POA: Insufficient documentation

## 2018-07-01 DIAGNOSIS — N10 Acute tubulo-interstitial nephritis: Secondary | ICD-10-CM | POA: Insufficient documentation

## 2018-07-01 LAB — POCT URINALYSIS DIP (DEVICE)
Glucose, UA: NEGATIVE mg/dL
Ketones, ur: 15 mg/dL — AB
Leukocytes, UA: NEGATIVE
Nitrite: POSITIVE — AB
PROTEIN: 100 mg/dL — AB
Specific Gravity, Urine: >=1.03 (ref 1.005–1.030)
Urobilinogen, UA: 0.2 mg/dL (ref 0.0–1.0)
pH: 5.5 (ref 5.0–8.0)

## 2018-07-01 MED ORDER — ONDANSETRON HCL 4 MG PO TABS: 8.0000 mg | ORAL_TABLET | ORAL | 0 refills | Status: DC | PRN

## 2018-07-01 MED ORDER — ONDANSETRON HCL 4 MG/2ML IJ SOLN
INTRAMUSCULAR | Status: AC
  Filled 2018-07-01: qty 2

## 2018-07-01 MED ORDER — KETOROLAC TROMETHAMINE 30 MG/ML IJ SOLN
INTRAMUSCULAR | Status: AC
  Filled 2018-07-01: qty 1

## 2018-07-01 MED ORDER — SODIUM CHLORIDE 0.9 % IV BOLUS
1000.0000 mL | Freq: Once | INTRAVENOUS | Status: AC
  Administered 2018-07-01: 1000 mL via INTRAVENOUS

## 2018-07-01 MED ORDER — LIDOCAINE HCL (PF) 1 % IJ SOLN
INTRAMUSCULAR | Status: AC
  Filled 2018-07-01: qty 2

## 2018-07-01 MED ORDER — KETOROLAC TROMETHAMINE 30 MG/ML IJ SOLN
30.0000 mg | Freq: Once | INTRAMUSCULAR | Status: AC
  Administered 2018-07-01: 30 mg via INTRAVENOUS

## 2018-07-01 MED ORDER — ONDANSETRON HCL 4 MG/2ML IJ SOLN
4.0000 mg | Freq: Once | INTRAMUSCULAR | Status: AC
  Administered 2018-07-01: 4 mg via INTRAVENOUS

## 2018-07-01 MED ORDER — KETOROLAC TROMETHAMINE 60 MG/2ML IM SOLN: 60.0000 mg | Freq: Once | INTRAMUSCULAR | Status: DC

## 2018-07-01 MED ORDER — CEFTRIAXONE SODIUM 1 G IJ SOLR
INTRAMUSCULAR | Status: AC
  Filled 2018-07-01: qty 10

## 2018-07-01 MED ORDER — CEFTRIAXONE SODIUM 1 G IJ SOLR
1.0000 g | Freq: Once | INTRAMUSCULAR | Status: AC
  Administered 2018-07-01: 1 g via INTRAMUSCULAR

## 2018-07-01 MED ORDER — ONDANSETRON HCL 4 MG/2ML IJ SOLN: 4.0000 mg | Freq: Once | INTRAMUSCULAR | Status: DC

## 2018-07-01 MED ORDER — CEPHALEXIN 500 MG PO CAPS: 500.0000 mg | ORAL_CAPSULE | Freq: Two times a day (BID) | ORAL | 0 refills | Status: AC

## 2018-07-01 NOTE — Discharge Instructions (Signed)
History and exam today suggested pyelonephritis, a kidney infection.  Urine culture is pending; the urgent care will contact you if a change in treatment is needed.  Injection of Rocephin (antibiotic), ondansetron (for nausea), ketorolac (anti-inflammatory/pain reliever) were given at the urgent care today.  2 L of IV fluids were also given.  Rest and push fluids.  Prescriptions for cephalexin, an antibiotic, and ondansetron, for nausea, were sent to the pharmacy.  Anticipate gradual improvement in well-being, fever, vomiting, and abdominal/flank pain over the next several days.

## 2018-07-01 NOTE — ED Triage Notes (Signed)
Pt presents with abdominal pain, fever, headaches, chills and vomiting

## 2018-07-01 NOTE — ED Provider Notes (Signed)
[facility]    CSN: [id number] Arrival date & time: 07/01/18  1113     History   Chief Complaint Chief Complaint  Patient presents with  . Abdominal Pain    headaches, fever, chills , vomiting    HPI Kimberly Bush is a 57 y.o. female.   Denies significant past medical history.  Presents today with one-week history of left flank/left lower quadrant pain, little bit of urinary frequency with small volumes.  Thought she had something viral, but symptoms in the last 3 or 4 days have worsened instead of resolving with headache, chills, achiness.  She had emesis x2 today.  No real change in bowel habits, maybe a little looser/darker than usual.  History of IBS.  No cough, no runny/congested nose, no sore throat.  No rash. History of allergies.    HPI  History reviewed. No pertinent past medical history.  History reviewed. No pertinent surgical history.    Home Medications    Prior to Admission medications   Medication Sig Start Date End Date Taking? Authorizing Provider  Acetaminophen (TYLENOL PO) Take 2 tablets by mouth daily as needed (for headache).    [provider]  cephALEXin (KEFLEX) 500 MG capsule Take 1 capsule (500 mg total) by mouth 2 (two) times daily for 7 days. 07/01/18 07/08/18  Isa Rankin, MD  ondansetron (ZOFRAN) 4 MG tablet Take 2 tablets (8 mg total) by mouth every 4 (four) hours as needed for nausea or vomiting. 07/01/18   Isa Rankin, MD  potassium chloride SA (K-DUR,KLOR-CON) 20 MEQ tablet Take 1 tablet (20 mEq total) by mouth 2 (two) times daily. 06/12/14 06/19/14  Mesner, Barbara Cower, MD    Family History History reviewed. No pertinent family history.  Social History Social History   Tobacco Use  . Smoking status: Never Smoker  Substance Use Topics  . Alcohol use: Yes    Comment: social  . Drug use: No     Allergies   Prednisone   Review of Systems Review of Systems  All other systems reviewed and are  negative.    Physical Exam Triage Vital Signs ED Triage Vitals  Enc Vitals Group     BP 07/01/18 1132 (!) 134/47     Pulse Rate 07/01/18 1132 91     Resp 07/01/18 1132 20     Temp 07/01/18 1132 99.9 F (37.7 C)     Temp Source 07/01/18 1132 Temporal     SpO2 07/01/18 1132 97 %     Weight --      Height --      Pain Score 07/01/18 1131 7     Pain Loc --    Updated Vital Signs BP (!) 134/47 (BP Location: Left Arm)   Pulse 95   Temp 99.1 F (37.3 C)   Resp 18   LMP  (LMP Unknown)   SpO2 100%  Physical Exam  Constitutional: She is oriented to person, place, and time. No distress.  Alert, lying down on exam table, but able to sit up for exam.  Looks ill but not toxic  HENT:  Head: Atraumatic.  Eyes:  Conjugate gaze, no eye redness/drainage  Neck: Neck supple.  Cardiovascular: Normal rate and regular rhythm.  Pulmonary/Chest: No respiratory distress. She has no wheezes. She has no rales.  Lungs clear, symmetric breath sounds  Abdominal: Soft. She exhibits no distension. There is no rebound and no guarding.  Mild tenderness to deep palpation across the lower abdomen, nonfocal.  Patient does not guard  Musculoskeletal: Normal range of motion.  No leg swelling  Neurological: She is alert and oriented to person, place, and time.  Skin: Skin is warm and dry.  No cyanosis  Nursing note and vitals reviewed.    UC Treatments / Results  Labs Results for orders placed or performed during the hospital encounter of 07/01/18  Urine culture  Result Value Ref Range   Specimen Description URINE, RANDOM    Special Requests      NONE Performed at [facility], [address] [place], [place] [address]    Culture (A)     >=100,000 COLONIES/mL ESCHERICHIA COLI >=100,000 COLONIES/mL DIPHTHEROIDS(CORYNEBACTERIUM SPECIES)    Report Status 07/03/2018 FINAL    Organism ID, Bacteria ESCHERICHIA COLI (A)       Susceptibility   Escherichia coli - MIC*    AMPICILLIN >=32  RESISTANT Resistant     CEFAZOLIN <=4 SENSITIVE Sensitive     CEFTRIAXONE <=1 SENSITIVE Sensitive     CIPROFLOXACIN <=0.25 SENSITIVE Sensitive     GENTAMICIN <=1 SENSITIVE Sensitive     IMIPENEM <=0.25 SENSITIVE Sensitive     NITROFURANTOIN <=16 SENSITIVE Sensitive     TRIMETH/SULFA >=320 RESISTANT Resistant     AMPICILLIN/SULBACTAM 16 INTERMEDIATE Intermediate     PIP/TAZO <=4 SENSITIVE Sensitive     Extended ESBL NEGATIVE Sensitive     * >=100,000 COLONIES/mL ESCHERICHIA COLI  POCT urinalysis dip (device)  Result Value Ref Range   Glucose, UA NEGATIVE NEGATIVE mg/dL   Bilirubin Urine SMALL (A) NEGATIVE   Ketones, ur 15 (A) NEGATIVE mg/dL   Specific Gravity, Urine >=1.030 1.005 - 1.030   Hgb urine dipstick MODERATE (A) NEGATIVE   pH 5.5 5.0 - 8.0   Protein, ur 100 (A) NEGATIVE mg/dL   Urobilinogen, UA 0.2 0.0 - 1.0 mg/dL   Nitrite POSITIVE (A) NEGATIVE   Leukocytes, UA NEGATIVE NEGATIVE    Procedures Procedures (including critical care time)  Medications Ordered in UC Medications  cefTRIAXone (ROCEPHIN) injection 1 g (1 g Intramuscular Given 07/01/18 1247)  sodium chloride 0.9 % bolus 1,000 mL (0 mLs Intravenous Stopped 07/01/18 1348)  ondansetron (ZOFRAN) injection 4 mg (4 mg Intravenous Given 07/01/18 1247)  ketorolac (TORADOL) 30 MG/ML injection 30 mg (30 mg Intravenous Given 07/01/18 1247)  sodium chloride 0.9 % bolus 1,000 mL (0 mLs Intravenous Stopped 07/01/18 1545)    Final Clinical Impressions(s) / UC Diagnoses   Final diagnoses:  Acute pyelonephritis     Discharge Instructions     History and exam today suggested pyelonephritis, a kidney infection.  Urine culture is pending; the urgent care will contact you if a change in treatment is needed.  Injection of Rocephin (antibiotic), ondansetron (for nausea), ketorolac (anti-inflammatory/pain reliever) were given at the urgent care today.  2 L of IV fluids were also given.  Rest and push fluids.  Prescriptions for  cephalexin, an antibiotic, and ondansetron, for nausea, were sent to the pharmacy.  Anticipate gradual improvement in well-being, fever, vomiting, and abdominal/flank pain over the next several days.    ED Prescriptions    Medication Sig Dispense Auth. Provider   cephALEXin (KEFLEX) 500 MG capsule Take 1 capsule (500 mg total) by mouth 2 (two) times daily for 7 days. 14 capsule Isa RankinMurray, Hallie Ertl Wilson, MD   ondansetron (ZOFRAN) 4 MG tablet Take 2 tablets (8 mg total) by mouth every 4 (four) hours as needed for nausea or vomiting. 20 tablet Isa RankinMurray, Harrol Novello Wilson, MD  Isa Rankin, MD 07/04/18 2107

## 2018-07-03 LAB — URINE CULTURE: Culture: >=100000 — AB

## 2018-10-18 ENCOUNTER — Encounter (HOSPITAL_COMMUNITY): Payer: Self-pay | Admitting: Emergency Medicine

## 2018-10-18 ENCOUNTER — Ambulatory Visit (HOSPITAL_COMMUNITY)
Admission: EM | Admit: 2018-10-18 | Discharge: 2018-10-18 | Disposition: A | Discharge status: Home / Self Care | Payer: Self-pay | Attending: Family Medicine | Admitting: Family Medicine

## 2018-10-18 ENCOUNTER — Other Ambulatory Visit: Payer: Self-pay

## 2018-10-18 DIAGNOSIS — R011 Cardiac murmur, unspecified: Secondary | ICD-10-CM

## 2018-10-18 DIAGNOSIS — R030 Elevated blood-pressure reading, without diagnosis of hypertension: Secondary | ICD-10-CM

## 2018-10-18 LAB — POCT I-STAT, CHEM 8
BUN: 7 mg/dL (ref 6–20)
CREATININE: 0.6 mg/dL (ref 0.44–1.00)
Calcium, Ion: 1.16 mmol/L (ref 1.15–1.40)
Chloride: 101 mmol/L (ref 98–111)
Glucose, Bld: 91 mg/dL (ref 70–99)
HCT: 46 % (ref 36.0–46.0)
Hemoglobin: 15.6 g/dL — ABNORMAL HIGH (ref 12.0–15.0)
Potassium: 4.4 mmol/L (ref 3.5–5.1)
Sodium: 138 mmol/L (ref 135–145)
TCO2: 31 mmol/L (ref 22–32)

## 2018-10-18 MED ORDER — LISINOPRIL 10 MG PO TABS: 10.0000 mg | ORAL_TABLET | Freq: Every day | ORAL | 2 refills | Status: DC

## 2018-10-18 NOTE — Discharge Instructions (Signed)
Blood work was normal EKG reviewed with Dr. Milus Glazier without acute changes We will start you on blood pressure medication today.   Please continue to monitor blood pressure at home and keep a log Eat a well balanced diet of fruits, vegetables and lean meats.  Avoid foods high in fat and salt Drink water.  At least half your body weight in ounces Exercise for at least 30 minutes daily Please follow up with PCP or cardiologist for further evaluation and management of heart murmur and elevated blood pressure Return or go to the ED if you have any new or worsening symptoms such as vision changes, fatigue, dizziness, chest pain, shortness of breath, nausea, swelling in your hands or feet, urinary symptoms, etc..Marland Kitchen

## 2018-10-18 NOTE — ED Triage Notes (Addendum)
187/112 at [facility], 177/ ?.  Was the repeat blood pressure reading. Non-specific, "just not normal".

## 2018-10-18 NOTE — ED Provider Notes (Signed)
[facility]   [id number] 10/18/18 Arrival Time: 1409  CC: HBP  SUBJECTIVE:  Kimberly Bush is a 57 y.o. female who presents with elevated blood pressure reading.  Checked BP earlier at [facility] was 182/112, 192/105 in office.  Denies hx of HTN.  Does not have a PCP. Complains of mild fatigue for the past week or 2, but otherwise asymptomatic. Denies HA, vision changes, dizziness, lightheadedness, chest pain, shortness of breath, numbness or tingling in extremities, abdominal pain, changes in bowel or bladder habits.    ROS: As per HPI.  Hx of HTN in mother at young age.    History reviewed. No pertinent past medical history. Past Surgical History:  Procedure Laterality Date  . BACK SURGERY     Allergies  Allergen Reactions  . Prednisone     Over heating and bleeding   No current facility-administered medications on file prior to encounter.    Current Outpatient Medications on File Prior to Encounter  Medication Sig Dispense Refill  . potassium chloride SA (K-DUR,KLOR-CON) 20 MEQ tablet Take 1 tablet (20 mEq total) by mouth 2 (two) times daily. 14 tablet 0    Social History   Socioeconomic History  . Marital status: Single    Spouse name: Not on file  . Number of children: Not on file  . Years of education: Not on file  . Highest education level: Not on file  Occupational History  . Not on file  Social Needs  . Financial resource strain: Not on file  . Food insecurity:    Worry: Not on file    Inability: Not on file  . Transportation needs:    Medical: Not on file    Non-medical: Not on file  Tobacco Use  . Smoking status: Never Smoker  Substance and Sexual Activity  . Alcohol use: Yes    Comment: social  . Drug use: No  . Sexual activity: Not on file  Lifestyle  . Physical activity:    Days per week: Not on file    Minutes per session: Not on file  . Stress: Not on file  Relationships  . Social connections:    Talks on phone: Not on file    Gets  together: Not on file    Attends religious service: Not on file    Active member of club or organization: Not on file    Attends meetings of clubs or organizations: Not on file    Relationship status: Not on file  . Intimate partner violence:    Fear of current or ex partner: Not on file    Emotionally abused: Not on file    Physically abused: Not on file    Forced sexual activity: Not on file  Other Topics Concern  . Not on file  Social History Narrative  . Not on file   History reviewed. No pertinent family history.   OBJECTIVE:  Vitals:   10/18/18 1516  BP: (!) 192/105  Pulse: 82  Resp: 18  Temp: 98.2 F (36.8 C)  TempSrc: Oral  SpO2: 97%    General appearance: AOx3 in no acute distress HEENT: PERRL.  EOM grossly intact.  no clear rhinorrhea; tonsils nonerythematous, uvula midline Neck: supple without LAD Lungs: clear to auscultation bilaterally without adventitious breath sounds Heart: Systolic murmur heard over aortic region.  Radial pulse 2+  Skin: warm and dry Psychological: alert and cooperative; normal mood and affect  Results for orders placed or performed during the hospital encounter of  10/18/18 (from the past 24 hour(s))  I-STAT, chem 8     Status: Abnormal   Collection Time: 10/18/18  3:43 PM  Result Value Ref Range   Sodium 138 135 - 145 mmol/L   Potassium 4.4 3.5 - 5.1 mmol/L   Chloride 101 98 - 111 mmol/L   BUN 7 6 - 20 mg/dL   Creatinine, Ser 1.61 0.44 - 1.00 mg/dL   Glucose, Bld 91 70 - 99 mg/dL   Calcium, Ion 0.96 0.45 - 1.40 mmol/L   TCO2 31 22 - 32 mmol/L   Hemoglobin 15.6 (H) 12.0 - 15.0 g/dL   HCT 40.9 81.1 - 91.4 %   EKG:  EKG sinus arrhythm without acute changes.  No ST elevations, depressions, or prolonged PR interval.  No narrowing or widening of the QRS complexes.  Reviewed EKG with Dr. Milus Glazier.    ASSESSMENT & PLAN:  1. Elevated blood pressure reading   2. Heart murmur     Meds ordered this encounter  Medications  .  lisinopril (PRINIVIL,ZESTRIL) 10 MG tablet    Sig: Take 1 tablet (10 mg total) by mouth daily.    Dispense:  30 tablet    Refill:  2    Order Specific Question:   Supervising Provider    Answer:   Isa Rankin [[id number]]   Blood work was normal EKG reviewed with Dr. Milus Glazier without acute changes We will start you on blood pressure medication today.   Please continue to monitor blood pressure at home and keep a log Eat a well balanced diet of fruits, vegetables and lean meats.  Avoid foods high in fat and salt Drink water.  At least half your body weight in ounces Exercise for at least 30 minutes daily Please follow up with PCP or cardiologist for further evaluation and management of heart murmur and elevated blood pressure Return or go to the ED if you have any new or worsening symptoms such as vision changes, fatigue, dizziness, chest pain, shortness of breath, nausea, swelling in your hands or feet, urinary symptoms, etc...  Reviewed expectations re: course of current medical issues. Questions answered. Outlined signs and symptoms indicating need for more acute intervention. Patient verbalized understanding. After Visit Summary given.          Rennis Harding, PA-C 10/18/18 1712

## 2021-12-30 ENCOUNTER — Ambulatory Visit
Admission: EM | Admit: 2021-12-30 | Discharge: 2021-12-30 | Disposition: A | Discharge status: Home / Self Care | Payer: Self-pay | Attending: Physician Assistant | Admitting: Physician Assistant

## 2021-12-30 ENCOUNTER — Encounter: Payer: Self-pay | Admitting: Emergency Medicine

## 2021-12-30 ENCOUNTER — Other Ambulatory Visit: Payer: Self-pay

## 2021-12-30 DIAGNOSIS — I10 Essential (primary) hypertension: Secondary | ICD-10-CM

## 2021-12-30 DIAGNOSIS — J209 Acute bronchitis, unspecified: Secondary | ICD-10-CM

## 2021-12-30 HISTORY — DX: Essential (primary) hypertension: I10

## 2021-12-30 MED ORDER — AZITHROMYCIN 250 MG PO TABS: 250.0000 mg | ORAL_TABLET | Freq: Every day | ORAL | 0 refills | Status: DC

## 2021-12-30 MED ORDER — LISINOPRIL 10 MG PO TABS: 10.0000 mg | ORAL_TABLET | Freq: Every day | ORAL | 0 refills | Status: DC

## 2021-12-30 MED ORDER — METHYLPREDNISOLONE 4 MG PO TBPK: ORAL_TABLET | ORAL | 0 refills | Status: DC

## 2021-12-30 NOTE — Discharge Instructions (Signed)
Please follow up with primary care as soon as possible regarding hypertension and starting lisinopril. Follow up sooner with any concerns.

## 2021-12-30 NOTE — ED Triage Notes (Signed)
Pt here for cough x 3 weeks without other sx; pt noted to have hypertension without known hx

## 2021-12-30 NOTE — ED Provider Notes (Signed)
[facility]    CSN: [id number] Arrival date & time: 12/30/21  0813      History   Chief Complaint Chief Complaint  Patient presents with   Cough    HPI Kimberly Bush is a 61 y.o. female.   Patient here today for evaluation of cough she said for the last 3 weeks.  She denies any other symptoms.  Blood pressure is elevated today in office and she states that typically she will have higher blood pressure when she is sick.  She denies history of official diagnosis of hypertension.  She does report that her mother had hypertension at a young age.  She denies any chest pain or significant headaches.  She has tried taking allergy medicine without significant relief of cough.  The history is provided by the patient.  Cough Associated symptoms: no chills, no ear pain, no eye discharge, no fever, no shortness of breath, no sore throat and no wheezing    Past Medical History:  Diagnosis Date   Hypertension     There are no problems to display for this patient.   Past Surgical History:  Procedure Laterality Date   BACK SURGERY      OB History   No obstetric history on file.      Home Medications    Prior to Admission medications   Medication Sig Start Date End Date Taking? Authorizing Provider  azithromycin (ZITHROMAX) 250 MG tablet Take 1 tablet (250 mg total) by mouth daily. Take first 2 tablets together, then 1 every day until finished. 12/30/21  Yes Tomi Bamberger, PA-C  methylPREDNISolone (MEDROL DOSEPAK) 4 MG TBPK tablet Take 4 tabs day 1-2, 3 tabs day 3-4, 2 tabs day 5-6, one tab days 7-9 12/30/21  Yes Tomi Bamberger, PA-C  lisinopril (ZESTRIL) 10 MG tablet Take 1 tablet (10 mg total) by mouth daily. 12/30/21 01/29/22  Tomi Bamberger, PA-C  potassium chloride SA (K-DUR,KLOR-CON) 20 MEQ tablet Take 1 tablet (20 mEq total) by mouth 2 (two) times daily. 06/12/14 06/19/14  Mesner, Barbara Cower, MD    Family History Family History  Problem Relation Age of Onset    Hypertension Mother     Social History Social History   Tobacco Use   Smoking status: Never  Substance Use Topics   Alcohol use: Yes    Comment: social   Drug use: No     Allergies   Prednisone   Review of Systems Review of Systems  Constitutional:  Negative for chills and fever.  HENT:  Positive for congestion. Negative for ear pain and sore throat.   Eyes:  Negative for discharge and redness.  Respiratory:  Positive for cough. Negative for shortness of breath and wheezing.   Gastrointestinal:  Negative for abdominal pain, diarrhea, nausea and vomiting.    Physical Exam Triage Vital Signs ED Triage Vitals  Enc Vitals Group     BP 12/30/21 0825 (!) 191/133     Pulse Rate 12/30/21 0825 75     Resp 12/30/21 0825 18     Temp 12/30/21 0825 98.3 F (36.8 C)     Temp Source 12/30/21 0825 Oral     SpO2 12/30/21 0825 97 %     Weight --      Height --      Head Circumference --      Peak Flow --      Pain Score 12/30/21 0826 0     Pain Loc --  Pain Edu? --      Excl. in GC? --    No data found.  Updated Vital Signs BP (!) 191/133 (BP Location: Left Arm)    Pulse 75    Temp 98.3 F (36.8 C) (Oral)    Resp 18    SpO2 97%      Physical Exam Vitals and nursing note reviewed.  Constitutional:      General: She is not in acute distress.    Appearance: Normal appearance. She is not ill-appearing.  HENT:     Head: Normocephalic and atraumatic.     Right Ear: Tympanic membrane normal.     Left Ear: Tympanic membrane normal.     Nose: No congestion or rhinorrhea.     Mouth/Throat:     Mouth: Mucous membranes are moist.     Pharynx: No oropharyngeal exudate or posterior oropharyngeal erythema.  Eyes:     Conjunctiva/sclera: Conjunctivae normal.  Cardiovascular:     Rate and Rhythm: Normal rate and regular rhythm.     Heart sounds: Normal heart sounds. No murmur heard. Pulmonary:     Effort: Pulmonary effort is normal. No respiratory distress.     Breath  sounds: Normal breath sounds. No wheezing, rhonchi or rales.  Skin:    General: Skin is warm and dry.  Neurological:     Mental Status: She is alert.  Psychiatric:        Mood and Affect: Mood normal.        Thought Content: Thought content normal.     UC Treatments / Results  Labs (all labs ordered are listed, but only abnormal results are displayed) Labs Reviewed - No data to display  EKG   Radiology No results found.  Procedures Procedures (including critical care time)  Medications Ordered in UC Medications - No data to display  Initial Impression / Assessment and Plan / UC Course  I have reviewed the triage vital signs and the nursing notes.  Pertinent labs & imaging results that were available during my care of the patient were reviewed by me and considered in my medical decision making (see chart for details).    Methylprednisolone and Z-Pak prescribed for suspected bronchitis as well as atypical pneumonia coverage.  Strongly recommended that she follow-up with her primary care provider regarding hypertension.  Lisinopril refilled from prior prescription.  Encouraged follow-up with any further concerns.  Final Clinical Impressions(s) / UC Diagnoses   Final diagnoses:  Acute bronchitis, unspecified organism  Essential hypertension     Discharge Instructions      Please follow up with primary care as soon as possible regarding hypertension and starting lisinopril. Follow up sooner with any concerns.      ED Prescriptions     Medication Sig Dispense Auth. Provider   methylPREDNISolone (MEDROL DOSEPAK) 4 MG TBPK tablet Take 4 tabs day 1-2, 3 tabs day 3-4, 2 tabs day 5-6, one tab days 7-9 21 tablet Erma Pinto F, PA-C   azithromycin (ZITHROMAX) 250 MG tablet Take 1 tablet (250 mg total) by mouth daily. Take first 2 tablets together, then 1 every day until finished. 6 tablet Erma Pinto F, PA-C   lisinopril (ZESTRIL) 10 MG tablet Take 1 tablet (10 mg  total) by mouth daily. 30 tablet Tomi Bamberger, PA-C      PDMP not reviewed this encounter.   Tomi Bamberger, PA-C 12/30/21 [phone]

## 2022-01-06 ENCOUNTER — Other Ambulatory Visit: Payer: Self-pay

## 2022-01-06 ENCOUNTER — Encounter: Payer: Self-pay | Admitting: Emergency Medicine

## 2022-01-06 ENCOUNTER — Ambulatory Visit
Admission: EM | Admit: 2022-01-06 | Discharge: 2022-01-06 | Disposition: A | Discharge status: Home / Self Care | Payer: Self-pay | Attending: Internal Medicine | Admitting: Internal Medicine

## 2022-01-06 ENCOUNTER — Ambulatory Visit
Admission: RE | Admit: 2022-01-06 | Discharge: 2022-01-06 | Disposition: A | Discharge status: Home / Self Care | Payer: No Typology Code available for payment source | Source: Ambulatory Visit | Attending: Internal Medicine | Admitting: Internal Medicine

## 2022-01-06 DIAGNOSIS — R053 Chronic cough: Secondary | ICD-10-CM

## 2022-01-06 DIAGNOSIS — I10 Essential (primary) hypertension: Secondary | ICD-10-CM

## 2022-01-06 MED ORDER — LISINOPRIL 20 MG PO TABS: 20.0000 mg | ORAL_TABLET | Freq: Every day | ORAL | 0 refills | Status: DC

## 2022-01-06 MED ORDER — BENZONATATE 100 MG PO CAPS: 100.0000 mg | ORAL_CAPSULE | Freq: Three times a day (TID) | ORAL | 0 refills | Status: DC | PRN

## 2022-01-06 NOTE — ED Provider Notes (Signed)
[facility]    CSN: [id number] Arrival date & time: 01/06/22  5465      History   Chief Complaint Chief Complaint  Patient presents with   Cough   Hypertension    HPI Kimberly Bush is a 61 y.o. female.   Patient presents today for persistent cough.  She was seen on 12/30/2021 with a cough that has been present for 3 weeks.  She was prescribed azithromycin and steroid Dosepak for acute bronchitis.  She reports that she did see some improvement but that cough has been persistent.  Cough is dry and nonproductive.  Denies chest pain, shortness of breath, sore throat, upper respiratory symptoms, nausea, vomiting, diarrhea, abdominal pain.  Denies any known fevers.   Patient is also concerned for elevated blood pressure.  She was restarted on lisinopril at this previous urgent care visit as well.  She has been taking her blood pressure at home and systolic blood pressure has ranged from 180s to 200s.  She has an appointment with PCP in 1.5 weeks but is worried that her blood pressure cannot be sustained that high that long.  Denies chest pain, shortness of breath, nausea, vomiting, headache, dizziness, blurred vision.  Has been over a year since she has seen PCP.  She took lisinopril years ago intermittently but has not taken since.    Cough Hypertension   Past Medical History:  Diagnosis Date   Hypertension     There are no problems to display for this patient.   Past Surgical History:  Procedure Laterality Date   BACK SURGERY      OB History   No obstetric history on file.      Home Medications    Prior to Admission medications   Medication Sig Start Date End Date Taking? Authorizing Provider  benzonatate (TESSALON) 100 MG capsule Take 1 capsule (100 mg total) by mouth every 8 (eight) hours as needed for cough. 01/06/22  Yes Jayvian Escoe, Rolly Salter E, FNP  azithromycin (ZITHROMAX) 250 MG tablet Take 1 tablet (250 mg total) by mouth daily. Take first 2 tablets together,  then 1 every day until finished. Patient not taking: Reported on 01/06/2022 12/30/21   Tomi Bamberger, PA-C  lisinopril (ZESTRIL) 20 MG tablet Take 1 tablet (20 mg total) by mouth daily. 01/06/22 02/05/22  Gustavus Bryant, FNP  methylPREDNISolone (MEDROL DOSEPAK) 4 MG TBPK tablet Take 4 tabs day 1-2, 3 tabs day 3-4, 2 tabs day 5-6, one tab days 7-9 Patient not taking: Reported on 01/06/2022 12/30/21   Tomi Bamberger, PA-C  potassium chloride SA (K-DUR,KLOR-CON) 20 MEQ tablet Take 1 tablet (20 mEq total) by mouth 2 (two) times daily. 06/12/14 06/19/14  Mesner, Barbara Cower, MD    Family History Family History  Problem Relation Age of Onset   Hypertension Mother     Social History Social History   Tobacco Use   Smoking status: Never  Substance Use Topics   Alcohol use: Yes    Comment: social   Drug use: No     Allergies   Prednisone   Review of Systems Review of Systems Per HPI  Physical Exam Triage Vital Signs ED Triage Vitals [01/06/22 0906]  Enc Vitals Group     BP (!) 175/124     Pulse Rate 80     Resp 18     Temp 99 F (37.2 C)     Temp Source Oral     SpO2 98 %     Weight  Height      Head Circumference      Peak Flow      Pain Score 0     Pain Loc      Pain Edu?      Excl. in GC?    No data found.  Updated Vital Signs BP (!) 175/124 (BP Location: Left Arm)    Pulse 80    Temp 99 F (37.2 C) (Oral)    Resp 18    SpO2 98%   Visual Acuity Right Eye Distance:   Left Eye Distance:   Bilateral Distance:    Right Eye Near:   Left Eye Near:    Bilateral Near:     Physical Exam Constitutional:      General: She is not in acute distress.    Appearance: Normal appearance. She is not toxic-appearing or diaphoretic.  HENT:     Head: Normocephalic and atraumatic.     Right Ear: Tympanic membrane and ear canal normal.     Left Ear: Tympanic membrane and ear canal normal.     Nose: No congestion.     Mouth/Throat:     Mouth: Mucous membranes are moist.      Pharynx: No posterior oropharyngeal erythema.  Eyes:     Extraocular Movements: Extraocular movements intact.     Conjunctiva/sclera: Conjunctivae normal.     Pupils: Pupils are equal, round, and reactive to light.  Cardiovascular:     Rate and Rhythm: Normal rate and regular rhythm.     Pulses: Normal pulses.     Heart sounds: Normal heart sounds.  Pulmonary:     Effort: Pulmonary effort is normal. No respiratory distress.     Breath sounds: Normal breath sounds. No stridor. No wheezing, rhonchi or rales.  Abdominal:     General: Abdomen is flat. Bowel sounds are normal.     Palpations: Abdomen is soft.  Musculoskeletal:        General: Normal range of motion.     Cervical back: Normal range of motion.  Skin:    General: Skin is warm and dry.  Neurological:     General: No focal deficit present.     Mental Status: She is alert and oriented to person, place, and time. Mental status is at baseline.     Cranial Nerves: Cranial nerves 2-12 are intact.     Sensory: Sensation is intact.     Motor: Motor function is intact.     Coordination: Coordination is intact.     Gait: Gait is intact.  Psychiatric:        Mood and Affect: Mood normal.        Behavior: Behavior normal.     UC Treatments / Results  Labs (all labs ordered are listed, but only abnormal results are displayed) Labs Reviewed - No data to display  EKG   Radiology No results found.  Procedures Procedures (including critical care time)  Medications Ordered in UC Medications - No data to display  Initial Impression / Assessment and Plan / UC Course  I have reviewed the triage vital signs and the nursing notes.  Pertinent labs & imaging results that were available during my care of the patient were reviewed by me and considered in my medical decision making (see chart for details).     Do think the patient needs a chest x-ray for persistent cough for almost 1 month now.  Do not have x-ray tech in urgent  care today.  [facility]  Awaiting result.  Will prescribe benzonatate to take as needed for cough.  Recheck of patient's blood pressure was 180 systolic.  Do think the patient needs additional blood pressure treatment given high blood pressure at home and in urgent care today.  Will increase lisinopril dose from 10 mg to 20 mg daily.  Patient to follow-up with PCP as soon as possible for further evaluation and management of blood pressure.  Do not think that patient is in need of immediate medical attention at the hospital at this time given that the neuro exam is normal and no signs of endorgan damage at this time.  Discussed return precautions.  Patient verbalized understanding and was agreeable with plan. Final Clinical Impressions(s) / UC Diagnoses   Final diagnoses:  Essential hypertension  Persistent cough for 3 weeks or longer     Discharge Instructions      Your dose of lisinopril has been increased from 10 mg to 20 mg daily.  Please continue to monitor your blood pressure at home and write it down.  Follow-up with primary care doctor at scheduled appointment.  A chest x-ray has been ordered for you.  [facility].  We will call with results.    ED Prescriptions     Medication Sig Dispense Auth. Provider   lisinopril (ZESTRIL) 20 MG tablet Take 1 tablet (20 mg total) by mouth daily. 30 tablet [place], [place] E, [place]   benzonatate (TESSALON) 100 MG capsule Take 1 capsule (100 mg total) by mouth every 8 (eight) hours as needed for cough. 21 capsule [place], Acie FredricksonHaley E, [place]NP      PDMP not reviewed this encounter.   Gustavus BryantMound, Hafsah Hendler E, [place]FNP 01/06/22 1006

## 2022-01-06 NOTE — ED Triage Notes (Signed)
Pt sts cough continuing x 1 week worse intermittently during coughing episodes; pt also sts just started on lisinopril and noted htn; pt sts has PCP appointment on 2/3

## 2022-01-06 NOTE — Discharge Instructions (Signed)
Your dose of lisinopril has been increased from 10 mg to 20 mg daily.  Please continue to monitor your blood pressure at home and write it down.  Follow-up with primary care doctor at scheduled appointment.  A chest x-ray has been ordered for you.  Please go to [facility] imaging today to have it completed.  We will call with results.

## 2022-01-09 NOTE — Progress Notes (Signed)
Normal. Patient notified. All questions answered. No change to therapy at this time.

## 2022-01-16 ENCOUNTER — Encounter: Payer: Self-pay | Admitting: Nurse Practitioner

## 2022-01-16 ENCOUNTER — Other Ambulatory Visit: Payer: Self-pay

## 2022-01-16 ENCOUNTER — Ambulatory Visit (INDEPENDENT_AMBULATORY_CARE_PROVIDER_SITE_OTHER): Payer: Self-pay | Admitting: Nurse Practitioner

## 2022-01-16 ENCOUNTER — Ambulatory Visit: Payer: Self-pay | Admitting: Nurse Practitioner

## 2022-01-16 VITALS — BP 165/79 | HR 79 | Temp 98.9°F | Ht 64.0 in | Wt 173.0 lb

## 2022-01-16 DIAGNOSIS — I10 Essential (primary) hypertension: Secondary | ICD-10-CM | POA: Insufficient documentation

## 2022-01-16 DIAGNOSIS — Z8249 Family history of ischemic heart disease and other diseases of the circulatory system: Secondary | ICD-10-CM | POA: Insufficient documentation

## 2022-01-16 DIAGNOSIS — Z6829 Body mass index (BMI) 29.0-29.9, adult: Secondary | ICD-10-CM | POA: Insufficient documentation

## 2022-01-16 DIAGNOSIS — Z7689 Persons encountering health services in other specified circumstances: Secondary | ICD-10-CM

## 2022-01-16 DIAGNOSIS — Z6828 Body mass index (BMI) 28.0-28.9, adult: Secondary | ICD-10-CM | POA: Insufficient documentation

## 2022-01-16 MED ORDER — LISINOPRIL 40 MG PO TABS: 40.0000 mg | ORAL_TABLET | Freq: Every day | ORAL | 1 refills | Status: DC

## 2022-01-16 MED ORDER — HYDROCHLOROTHIAZIDE 25 MG PO TABS: 12.5000 mg | ORAL_TABLET | Freq: Every day | ORAL | 1 refills | Status: DC

## 2022-01-16 NOTE — Patient Instructions (Signed)
How to Take Your Blood Pressure ?Blood pressure measures how strongly your blood is pressing against the walls of your arteries. Arteries are blood vessels that carry blood from your heart throughout your body. You can take your blood pressure at home with a machine. ?You may need to check your blood pressure at home: ?To check if you have high blood pressure (hypertension). ?To check your blood pressure over time. ?To make sure your blood pressure medicine is working. ?Supplies needed: ?Blood pressure machine, or monitor. ?Dining room chair to sit in. ?Table or desk. ?Small notebook. ?Pencil or pen. ?How to prepare ?Avoid these things for 30 minutes before checking your blood pressure: ?Having drinks with caffeine in them, such as coffee or tea. ?Drinking alcohol. ?Eating. ?Smoking. ?Exercising. ?Do these things five minutes before checking your blood pressure: ?Go to the bathroom and pee (urinate). ?Sit in a dining chair. Do not sit on a soft couch or an armchair. ?Be quiet. Do not talk. ?How to take your blood pressure ?Follow the instructions that came with your machine. If you have a digital blood pressure monitor, these may be the instructions: ?Sit up straight. ?Place your feet on the floor. Do not cross your ankles or legs. ?Rest your left arm at the level of your heart. You may rest it on a table, desk, or chair. ?Pull up your shirt sleeve. ?Wrap the blood pressure cuff around the upper part of your left arm. The cuff should be 1 inch (2.5 cm) above your elbow. It is best to wrap the cuff around bare skin. ?Fit the cuff snugly around your arm. You should be able to place only one finger between the cuff and your arm. ?Place the cord so that it rests in the bend of your elbow. ?Press the power button. ?Sit quietly while the cuff fills with air and loses air. ?Write down the numbers on the screen. ?Wait 2-3 minutes and then repeat steps 1-10. ?What do the numbers mean? ?Two numbers make up your blood  pressure. The first number is called systolic pressure. The second is called diastolic pressure. An example of a blood pressure reading is "120 over 80" (or 120/80). ?If you are an adult and do not have a medical condition, use this guide to find out if your blood pressure is normal: ?Normal ?First number: below 120. ?Second number: below 80. ?Elevated ?First number: 120-129. ?Second number: below 80. ?Hypertension stage 1 ?First number: 130-139. ?Second number: 80-89. ?Hypertension stage 2 ?First number: 140 or above. ?Second number: 90 or above. ?Your blood pressure is above normal even if only the first or only the second number is above normal. ?Follow these instructions at home: ?Medicines ?Take over-the-counter and prescription medicines only as told by your doctor. ?Tell your doctor if your medicine is causing side effects. ?General instructions ?Check your blood pressure as often as your doctor tells you to. ?Check your blood pressure at the same time every day. ?Take your monitor to your next doctor's appointment. Your doctor will: ?Make sure you are using it correctly. ?Make sure it is working right. ?Understand what your blood pressure numbers should be. ?Keep all follow-up visits as told by your doctor. This is important. ?General tips ?You will need a blood pressure machine, or monitor. Your doctor can suggest a monitor. You can buy one at a drugstore or online. When choosing one: ?Choose one with an arm cuff. ?Choose one that wraps around your upper arm. Only one finger should fit between   your arm and the cuff. ?Do not choose one that measures your blood pressure from your wrist or finger. ?Where to find more information ?American Heart Association: www.heart.org ?Contact a doctor if: ?Your blood pressure keeps being high. ?Your blood pressure is suddenly low. ?Get help right away if: ?Your first blood pressure number is higher than 180. ?Your second blood pressure number is higher than  120. ?Summary ?Check your blood pressure at the same time every day. ?Avoid caffeine, alcohol, smoking, and exercise for 30 minutes before checking your blood pressure. ?Make sure you understand what your blood pressure numbers should be. ?This information is not intended to replace advice given to you by your health care provider. Make sure you discuss any questions you have with your health care provider. ?Document Revised: 10/09/2020 Document Reviewed: 11/24/2019 ?Elsevier Patient Education ? 2022 [employer] ? ?

## 2022-01-16 NOTE — Progress Notes (Signed)
New Patient Office Visit  Subjective:  Patient ID: Kimberly Bush, female    DOB: Oct 17, 1961  Age: 61 y.o. MRN: [id number]  CC:  Chief Complaint  Patient presents with   New Patient (Initial Visit)    HPI Kimberly JanskyVicki Bush presents to establish new primary care provider. She has not had primary care in some time. Does not have health insurance and only sees provider when she gets sick. Has recently started having trouble with her blood pressure. Patient's mother had a long history of hypertension along with CAD and carotid artery stenosis.  Patient states that she had been feeling "off" for several days. Checked her blood pressure and was 193/133. She did go to urgent care. They started her on lisinopril 10mg  daily. This has gradually increased to 20mg . When blood pressure was still elevated, she increased her dose to 40mg  on her own. Blood pressure is improved, though still elevated. She states that she feels well. Feels beter since her blood pressure has improved.  She denies chest pain, chest pressure, or shortness of breath. She denies headaches or visual disturbances. She denies abdominal pain, nausea, vomiting, or changes in bowel or bladder habits.     Past Medical History:  Diagnosis Date   Hypertension     Past Surgical History:  Procedure Laterality Date   BACK SURGERY      Family History  Problem Relation Age of Onset   Hypertension Mother     Social History   Socioeconomic History   Marital status: Single    Spouse name: Not on file   Number of children: Not on file   Years of education: Not on file   Highest education level: Not on file  Occupational History   Not on file  Tobacco Use   Smoking status: Never   Smokeless tobacco: Not on file  Substance and Sexual Activity   Alcohol use: Yes    Comment: social   Drug use: No   Sexual activity: Not on file  Other Topics Concern   Not on file  Social History Narrative   Not on file   Social Determinants of  Health   Financial Resource Strain: Not on file  Food Insecurity: Not on file  Transportation Needs: Not on file  Physical Activity: Not on file  Stress: Not on file  Social Connections: Not on file  Intimate Partner Violence: Not on file    ROS Review of Systems  Constitutional:  Negative for activity change, appetite change, chills, fatigue and fever.  HENT:  Negative for congestion, postnasal drip, rhinorrhea, sinus pressure, sinus pain, sneezing and sore throat.   Eyes: Negative.   Respiratory:  Positive for cough. Negative for chest tightness, shortness of breath and wheezing.   Cardiovascular:  Negative for chest pain and palpitations.       Blood pressure elevated   Gastrointestinal:  Negative for abdominal pain, constipation, diarrhea, nausea and vomiting.  Endocrine: Negative for cold intolerance, heat intolerance, polydipsia and polyuria.  Genitourinary:  Negative for dyspareunia, dysuria, flank pain, frequency and urgency.  Musculoskeletal:  Negative for arthralgias, back pain and myalgias.  Skin:  Negative for rash.  Allergic/Immunologic: Negative for environmental allergies.  Neurological:  Negative for dizziness, weakness and headaches.  Hematological:  Negative for adenopathy.  Psychiatric/Behavioral:  The patient is not nervous/anxious.    Objective:   Today's Vitals   01/16/22 0828  BP: (!) 165/79  Pulse: 79  Temp: 98.9 F (37.2 C)  SpO2: 98%  Weight:  173 lb (78.5 kg)  Height: 5\' 4"  (1.626 m)   Body mass index is 29.7 kg/m.   Physical Exam Vitals and nursing note reviewed.  Constitutional:      Appearance: Normal appearance. She is well-developed.  HENT:     Head: Normocephalic and atraumatic.     Nose: Nose normal.  Eyes:     Pupils: Pupils are equal, round, and reactive to light.  Neck:     Vascular: No carotid bruit.  Cardiovascular:     Rate and Rhythm: Normal rate and regular rhythm.     Pulses: Normal pulses.     Heart sounds: Normal  heart sounds.  Pulmonary:     Effort: Pulmonary effort is normal.     Breath sounds: Normal breath sounds.  Abdominal:     Palpations: Abdomen is soft.  Musculoskeletal:        General: Normal range of motion.     Cervical back: Normal range of motion and neck supple.  Lymphadenopathy:     Cervical: No cervical adenopathy.  Skin:    General: Skin is warm and dry.     Capillary Refill: Capillary refill takes less than 2 seconds.  Neurological:     General: No focal deficit present.     Mental Status: She is alert and oriented to person, place, and time.  Psychiatric:        Mood and Affect: Mood normal.        Behavior: Behavior normal.        Thought Content: Thought content normal.        Judgment: Judgment normal.    Assessment & Plan:  1. Encounter to establish care Appointment today to establish new primary care provider    2. Essential hypertension Change lisinopril to 40mg  daily. Add HCTZ 25mg . Advised her to start with 1/2 tablet daily. Monitor blood pressure at home. Goal is to have blood pressure at 140/80 or better. If blood pressure continues to be elevated, she may increased dose of HCTZ to 25mg  daily. Encouraged her to limit intake of salt in her diet and increase water. Will follow up in four weeks.  - lisinopril (ZESTRIL) 40 MG tablet; Take 1 tablet (40 mg total) by mouth daily.  Dispense: 30 tablet; Refill: 1 - hydrochlorothiazide (HYDRODIURIL) 25 MG tablet; Take 0.5-1 tablets (12.5-25 mg total) by mouth daily.  Dispense: 30 tablet; Refill: 1  3. Body mass index 29.0-29.9, adult Discussed lowering calorie intake to 1500 calories per day and incorporating exercise into daily routine to help lose weight.   4. Family history of early CAD Strong family history of CAD and HTN. Will check fasting labs at next visit.     Problem List Items Addressed This Visit       Cardiovascular and Mediastinum   Essential hypertension   Relevant Medications   lisinopril  (ZESTRIL) 40 MG tablet   hydrochlorothiazide (HYDRODIURIL) 25 MG tablet     Other   Body mass index 29.0-29.9, adult   Family history of early CAD   Other Visit Diagnoses     Encounter to establish care    -  Primary       Outpatient Encounter Medications as of 01/16/2022  Medication Sig   benzonatate (TESSALON) 100 MG capsule Take 1 capsule (100 mg total) by mouth every 8 (eight) hours as needed for cough.   hydrochlorothiazide (HYDRODIURIL) 25 MG tablet Take 0.5-1 tablets (12.5-25 mg total) by mouth daily.   [DISCONTINUED] lisinopril (ZESTRIL) 20  MG tablet Take 1 tablet (20 mg total) by mouth daily. (Patient taking differently: Take 40 mg by mouth daily. Patient states she has increased the dose to 40MG .)   lisinopril (ZESTRIL) 40 MG tablet Take 1 tablet (40 mg total) by mouth daily.   potassium chloride SA (K-DUR,KLOR-CON) 20 MEQ tablet Take 1 tablet (20 mEq total) by mouth 2 (two) times daily.   [DISCONTINUED] azithromycin (ZITHROMAX) 250 MG tablet Take 1 tablet (250 mg total) by mouth daily. Take first 2 tablets together, then 1 every day until finished. (Patient not taking: Reported on 01/06/2022)   [DISCONTINUED] methylPREDNISolone (MEDROL DOSEPAK) 4 MG TBPK tablet Take 4 tabs day 1-2, 3 tabs day 3-4, 2 tabs day 5-6, one tab days 7-9 (Patient not taking: Reported on 01/06/2022)   No facility-administered encounter medications on file as of 01/16/2022.    Follow-up: Return in about 4 weeks (around 02/13/2022) for HTN, FBW at time of visit.   04/15/2022, NP

## 2022-02-12 ENCOUNTER — Other Ambulatory Visit: Payer: Self-pay

## 2022-02-12 DIAGNOSIS — Z1321 Encounter for screening for nutritional disorder: Secondary | ICD-10-CM

## 2022-02-12 DIAGNOSIS — Z0000 Encounter for general adult medical examination without abnormal findings: Secondary | ICD-10-CM

## 2022-02-12 DIAGNOSIS — Z7689 Persons encountering health services in other specified circumstances: Secondary | ICD-10-CM

## 2022-02-12 DIAGNOSIS — Z130 Encounter for screening for diseases of the blood and blood-forming organs and certain disorders involving the immune mechanism: Secondary | ICD-10-CM

## 2022-02-13 ENCOUNTER — Ambulatory Visit (INDEPENDENT_AMBULATORY_CARE_PROVIDER_SITE_OTHER): Payer: 59 | Admitting: Nurse Practitioner

## 2022-02-13 ENCOUNTER — Other Ambulatory Visit: Payer: Self-pay

## 2022-02-13 ENCOUNTER — Encounter: Payer: Self-pay | Admitting: Nurse Practitioner

## 2022-02-13 VITALS — BP 107/68 | HR 66 | Temp 98.6°F | Ht 64.0 in | Wt 166.8 lb

## 2022-02-13 DIAGNOSIS — Z0000 Encounter for general adult medical examination without abnormal findings: Secondary | ICD-10-CM

## 2022-02-13 DIAGNOSIS — Z1231 Encounter for screening mammogram for malignant neoplasm of breast: Secondary | ICD-10-CM

## 2022-02-13 DIAGNOSIS — Z1321 Encounter for screening for nutritional disorder: Secondary | ICD-10-CM

## 2022-02-13 DIAGNOSIS — Z6828 Body mass index (BMI) 28.0-28.9, adult: Secondary | ICD-10-CM | POA: Diagnosis not present

## 2022-02-13 DIAGNOSIS — Z130 Encounter for screening for diseases of the blood and blood-forming organs and certain disorders involving the immune mechanism: Secondary | ICD-10-CM

## 2022-02-13 DIAGNOSIS — K219 Gastro-esophageal reflux disease without esophagitis: Secondary | ICD-10-CM | POA: Diagnosis not present

## 2022-02-13 DIAGNOSIS — Z13228 Encounter for screening for other metabolic disorders: Secondary | ICD-10-CM

## 2022-02-13 DIAGNOSIS — I10 Essential (primary) hypertension: Secondary | ICD-10-CM | POA: Diagnosis not present

## 2022-02-13 DIAGNOSIS — Z1211 Encounter for screening for malignant neoplasm of colon: Secondary | ICD-10-CM

## 2022-02-13 DIAGNOSIS — Z1329 Encounter for screening for other suspected endocrine disorder: Secondary | ICD-10-CM

## 2022-02-13 DIAGNOSIS — Z7689 Persons encountering health services in other specified circumstances: Secondary | ICD-10-CM

## 2022-02-13 MED ORDER — OMEPRAZOLE 40 MG PO CPDR: 40.0000 mg | DELAYED_RELEASE_CAPSULE | Freq: Every day | ORAL | 2 refills | Status: DC

## 2022-02-13 NOTE — Progress Notes (Signed)
Established patient visit ? ? ?Patient: Kimberly Bush   DOB: 08-21-1961   61 y.o. Female  MRN: [id number] ?Visit Date: 02/13/2022 ? ? ?Chief Complaint  ?Patient presents with  ? Hypertension  ? ?Subjective  ?  ?Hypertension ?Pertinent negatives include no chest pain, headaches, palpitations or shortness of breath.   ?The patient is here for routine follow up of high blood pressure. Added HCTZ 56m. Patient was prescribed 1/2 to 1 tablet daily. States that she has only been taking 1/2 tablet. She is also taking lisinopril 443mdaily. Tolerating well. Blood pressure appears well managed. She states that she does check blood pressure at home. States that pressure at home is generally running around 135/85. She denies any negative side effects from taking either of these medications. States that she feels so much better now that blood pressure is well controlled.  ?-due for routine, fasting labs today ?-due to have screening mammogram.  ?-should have CPE and pap smear.  ?-due for colon cancer screening.  ? ? ?Medications: ?Outpatient Medications Prior to Visit  ?Medication Sig  ? benzonatate (TESSALON) 100 MG capsule Take 1 capsule (100 mg total) by mouth every 8 (eight) hours as needed for cough.  ? hydrochlorothiazide (HYDRODIURIL) 25 MG tablet Take 0.5-1 tablets (12.5-25 mg total) by mouth daily.  ? lisinopril (ZESTRIL) 40 MG tablet Take 1 tablet (40 mg total) by mouth daily.  ? potassium chloride SA (K-DUR,KLOR-CON) 20 MEQ tablet Take 1 tablet (20 mEq total) by mouth 2 (two) times daily.  ? ?No facility-administered medications prior to visit.  ? ? ?Review of Systems  ?Constitutional:  Negative for activity change, appetite change, chills, fatigue and fever.  ?HENT:  Negative for congestion, postnasal drip, rhinorrhea, sinus pressure, sinus pain, sneezing and sore throat.   ?Eyes: Negative.   ?Respiratory:  Negative for cough, chest tightness, shortness of breath and wheezing.   ?Cardiovascular:  Negative for chest pain  and palpitations.  ?     Blood pressure is well managed   ?Gastrointestinal:  Negative for abdominal pain, constipation, diarrhea, nausea and vomiting.  ?     Having increased indigestion.   ?Endocrine: Negative for cold intolerance, heat intolerance, polydipsia and polyuria.  ?Genitourinary:  Negative for dyspareunia, dysuria, flank pain, frequency and urgency.  ?Musculoskeletal:  Negative for arthralgias, back pain and myalgias.  ?Skin:  Negative for rash.  ?Allergic/Immunologic: Negative for environmental allergies.  ?Neurological:  Negative for dizziness, weakness and headaches.  ?Hematological:  Negative for adenopathy.  ?Psychiatric/Behavioral:  The patient is not nervous/anxious.   ? ? ? ? Objective  ?  ? ?Today's Vitals  ? 02/13/22 0935  ?BP: 107/68  ?Pulse: 66  ?Temp: 98.6 ?F (37 ?C)  ?SpO2: 98%  ?Weight: 166 lb 12.8 oz (75.7 kg)  ?Height: _0  (1.626 m)  ? ?Body mass index is 28.63 kg/m?.  ? ?BP Readings from Last 3 Encounters:  ?02/13/22 107/68  ?01/16/22 (!) 165/79  ?01/06/22 (!) 175/124  ?  ?Wt Readings from Last 3 Encounters:  ?02/13/22 166 lb 12.8 oz (75.7 kg)  ?01/16/22 173 lb (78.5 kg)  ?  ?Physical Exam ?Vitals and nursing note reviewed.  ?Constitutional:   ?   Appearance: Normal appearance. She is well-developed.  ?HENT:  ?   Head: Normocephalic and atraumatic.  ?Eyes:  ?   Pupils: Pupils are equal, round, and reactive to light.  ?Cardiovascular:  ?   Rate and Rhythm: Normal rate and regular rhythm.  ?   Pulses: Normal pulses.  ?  Heart sounds: Normal heart sounds.  ?Pulmonary:  ?   Effort: Pulmonary effort is normal.  ?   Breath sounds: Normal breath sounds.  ?Abdominal:  ?   Palpations: Abdomen is soft.  ?Musculoskeletal:     ?   General: Normal range of motion.  ?   Cervical back: Normal range of motion and neck supple.  ?Lymphadenopathy:  ?   Cervical: No cervical adenopathy.  ?Skin: ?   General: Skin is warm and dry.  ?   Capillary Refill: Capillary refill takes less than 2 seconds.   ?Neurological:  ?   General: No focal deficit present.  ?   Mental Status: She is alert and oriented to person, place, and time.  ?Psychiatric:     ?   Mood and Affect: Mood normal.     ?   Behavior: Behavior normal.     ?   Thought Content: Thought content normal.     ?   Judgment: Judgment normal.  ?  ? ? Assessment & Plan  ?  ?1. Essential hypertension ?Blood pressure well managed. Continue medication as prescribed  ? ?2. Gastroesophageal reflux disease without esophagitis ?Start omeprazole 65m daily. Encouraged her to avoid triggers which include spicy and acidic foods. May need to sleep with HOB elevated to 30 degrees.  ?- omeprazole (PRILOSEC) 40 MG capsule; Take 1 capsule (40 mg total) by mouth daily.  Dispense: 30 capsule; Refill: 2 ? ?3. Body mass index 28.0-28.9, adult ?Discussed lowering calorie intake to 1500 calories per day and incorporating exercise into daily routine to help lose weight.  ? ?4. Screening for endocrine, nutritional, metabolic and immunity disorder ?Routine, fasting labs drawn during today's visit.  ?- Lipid panel ?- TSH ?- Comp Met (CMET) ?- CBC ?- Hemoglobin A1c ? ?5. Screening for colon cancer ?New order for Cologuard sent to [facility]  ?- Cologuard ? ?6. Encounter for screening mammogram for malignant neoplasm of breast ?Order for screening mammogram ordered today  ?- MM DIGITAL SCREENING BILATERAL; Future ? ?7. Health care maintenance ?Routine, fasting labs drawn during today's visit.  ? ?  ?Problem List Items Addressed This Visit   ? ?  ? Cardiovascular and Mediastinum  ? Essential hypertension - Primary  ?  ? Digestive  ? Gastroesophageal reflux disease without esophagitis  ? Relevant Medications  ? omeprazole (PRILOSEC) 40 MG capsule  ?  ? Other  ? Body mass index 28.0-28.9, adult  ? ?Other Visit Diagnoses   ? ? Screening for endocrine, nutritional, metabolic and immunity disorder      ? Screening for colon cancer      ? Relevant Orders  ? Cologuard  ? Encounter for  screening mammogram for malignant neoplasm of breast      ? Relevant Orders  ? MM DIGITAL SCREENING BILATERAL  ? Health care maintenance      ? ?  ?  ? ?Return in about 6 weeks (around 03/27/2022) for health maintenance exam, with pap.  ?   ? ? ? ? ?HRonnell Freshwater NP  ?[facility]?[phone](phone) ?[phone](fax) ? ?[facility]  ?

## 2022-02-14 LAB — COMPREHENSIVE METABOLIC PANEL
ALT: 24 IU/L (ref 0–32)
AST: 23 IU/L (ref 0–40)
Albumin/Globulin Ratio: 2.2 (ref 1.2–2.2)
Albumin: 4.4 g/dL (ref 3.8–4.9)
Alkaline Phosphatase: 94 IU/L (ref 44–121)
BUN/Creatinine Ratio: 10 (ref 9–23)
BUN: 9 mg/dL (ref 6–24)
Bilirubin Total: 1.1 mg/dL (ref 0.0–1.2)
CO2: 24 mmol/L (ref 20–29)
Calcium: 9.2 mg/dL (ref 8.7–10.2)
Chloride: 101 mmol/L (ref 96–106)
Creatinine, Ser: 0.87 mg/dL (ref 0.57–1.00)
Globulin, Total: 2 g/dL (ref 1.5–4.5)
Glucose: 85 mg/dL (ref 70–99)
Potassium: 4.1 mmol/L (ref 3.5–5.2)
Sodium: 141 mmol/L (ref 134–144)
Total Protein: 6.4 g/dL (ref 6.0–8.5)
eGFR: 77 mL/min/{1.73_m2} (ref >59)

## 2022-02-14 LAB — CBC
Hematocrit: 39.3 % (ref 34.0–46.6)
Hemoglobin: 13.5 g/dL (ref 11.1–15.9)
MCH: 32.5 pg (ref 26.6–33.0)
MCHC: 34.4 g/dL (ref 31.5–35.7)
MCV: 95 fL (ref 79–97)
Platelets: 168 10*3/uL (ref 150–450)
RBC: 4.15 x10E6/uL (ref 3.77–5.28)
RDW: 12.7 % (ref 11.7–15.4)
WBC: 5 10*3/uL (ref 3.4–10.8)

## 2022-02-14 LAB — LIPID PANEL
Chol/HDL Ratio: 5.2 ratio — ABNORMAL HIGH (ref 0.0–4.4)
Cholesterol, Total: 214 mg/dL — ABNORMAL HIGH (ref 100–199)
HDL: 41 mg/dL (ref >39)
LDL Chol Calc (NIH): 144 mg/dL — ABNORMAL HIGH (ref 0–99)
Triglycerides: 161 mg/dL — ABNORMAL HIGH (ref 0–149)
VLDL Cholesterol Cal: 29 mg/dL (ref 5–40)

## 2022-02-14 LAB — HEMOGLOBIN A1C
Est. average glucose Bld gHb Est-mCnc: 120 mg/dL
Hgb A1c MFr Bld: 5.8 % — ABNORMAL HIGH (ref 4.8–5.6)

## 2022-02-14 LAB — TSH: TSH: 2.24 u[IU]/mL (ref 0.450–4.500)

## 2022-02-21 DIAGNOSIS — K219 Gastro-esophageal reflux disease without esophagitis: Secondary | ICD-10-CM | POA: Insufficient documentation

## 2022-02-26 NOTE — Progress Notes (Signed)
Mild elevation of lipid panel and mild elevation of HgbA1c. Discuss at next visit 03/2022

## 2022-03-24 ENCOUNTER — Ambulatory Visit: Admission: RE | Admit: 2022-03-24 | Payer: 59 | Source: Ambulatory Visit

## 2022-03-25 ENCOUNTER — Other Ambulatory Visit: Payer: Self-pay | Admitting: Nurse Practitioner

## 2022-03-25 DIAGNOSIS — Z1231 Encounter for screening mammogram for malignant neoplasm of breast: Secondary | ICD-10-CM

## 2022-03-27 ENCOUNTER — Ambulatory Visit: Payer: Self-pay | Admitting: Nurse Practitioner

## 2022-03-31 ENCOUNTER — Ambulatory Visit (INDEPENDENT_AMBULATORY_CARE_PROVIDER_SITE_OTHER): Payer: 59 | Admitting: Nurse Practitioner

## 2022-03-31 ENCOUNTER — Other Ambulatory Visit (HOSPITAL_COMMUNITY)
Admission: RE | Admit: 2022-03-31 | Discharge: 2022-03-31 | Disposition: A | Discharge status: Home / Self Care | Payer: 59 | Source: Ambulatory Visit | Attending: Nurse Practitioner | Admitting: Nurse Practitioner

## 2022-03-31 ENCOUNTER — Encounter: Payer: Self-pay | Admitting: Nurse Practitioner

## 2022-03-31 VITALS — BP 110/70 | HR 63 | Temp 98.4°F | Ht 64.17 in | Wt 165.9 lb

## 2022-03-31 DIAGNOSIS — Z23 Encounter for immunization: Secondary | ICD-10-CM | POA: Diagnosis not present

## 2022-03-31 DIAGNOSIS — I10 Essential (primary) hypertension: Secondary | ICD-10-CM | POA: Diagnosis not present

## 2022-03-31 DIAGNOSIS — K219 Gastro-esophageal reflux disease without esophagitis: Secondary | ICD-10-CM | POA: Diagnosis not present

## 2022-03-31 DIAGNOSIS — Z6828 Body mass index (BMI) 28.0-28.9, adult: Secondary | ICD-10-CM

## 2022-03-31 DIAGNOSIS — Z6829 Body mass index (BMI) 29.0-29.9, adult: Secondary | ICD-10-CM

## 2022-03-31 DIAGNOSIS — Z01419 Encounter for gynecological examination (general) (routine) without abnormal findings: Secondary | ICD-10-CM

## 2022-03-31 DIAGNOSIS — E782 Mixed hyperlipidemia: Secondary | ICD-10-CM | POA: Diagnosis not present

## 2022-03-31 MED ORDER — LISINOPRIL 40 MG PO TABS: 40.0000 mg | ORAL_TABLET | Freq: Every day | ORAL | 1 refills | Status: DC

## 2022-03-31 NOTE — Progress Notes (Signed)
Established patient visit ? ? ?Patient: Kimberly Bush   DOB: [birth year]   61 y.o. Female  MRN: [id number] ?Visit Date: 03/31/2022 ? ? ?Chief Complaint  ?Patient presents with  ? Annual Exam  ? Gynecologic Exam  ? ?Subjective  ?  ?HPI  ?Patient here for annual wellness visit. ?--routine, fasting labs done prior to this visit.  ?--mild, generalized elevation of lipids.  ?-- Blood sugar normal.  Hemoglobin A1c 5.8. ?-Blood pressure well managed. ?-Patient due for tetanus vaccine. ?-denies chest pain, chest pressure, or shortness of breath. He denies headaches or visual disturbances. He denies abdominal pain, nausea, vomiting, or changes in bowel or bladder habits.   ? ? ?Medications: ?Outpatient Medications Prior to Visit  ?Medication Sig  ? hydrochlorothiazide (HYDRODIURIL) 25 MG tablet Take 0.5-1 tablets (12.5-25 mg total) by mouth daily.  ? omeprazole (PRILOSEC) 40 MG capsule Take 1 capsule (40 mg total) by mouth daily.  ? [DISCONTINUED] lisinopril (ZESTRIL) 40 MG tablet Take 1 tablet (40 mg total) by mouth daily.  ? potassium chloride SA (K-DUR,KLOR-CON) 20 MEQ tablet Take 1 tablet (20 mEq total) by mouth 2 (two) times daily.  ? [DISCONTINUED] benzonatate (TESSALON) 100 MG capsule Take 1 capsule (100 mg total) by mouth every 8 (eight) hours as needed for cough.  ? ?No facility-administered medications prior to visit.  ? ? ?Review of Systems  ?Constitutional:  Negative for activity change, appetite change, chills, fatigue and fever.  ?HENT:  Negative for congestion, postnasal drip, rhinorrhea, sinus pressure, sinus pain, sneezing and sore throat.   ?Eyes: Negative.   ?Respiratory:  Negative for cough, chest tightness, shortness of breath and wheezing.   ?Cardiovascular:  Negative for chest pain and palpitations.  ?Gastrointestinal:  Negative for abdominal pain, constipation, diarrhea, nausea and vomiting.  ?Endocrine: Negative for cold intolerance, heat intolerance, polydipsia and polyuria.  ?Genitourinary:  Negative for  dyspareunia, dysuria, flank pain, frequency and urgency.  ?Musculoskeletal:  Negative for arthralgias, back pain and myalgias.  ?Skin:  Negative for rash.  ?Allergic/Immunologic: Negative for environmental allergies.  ?Neurological:  Negative for dizziness, weakness and headaches.  ?Hematological:  Negative for adenopathy.  ?Psychiatric/Behavioral:  The patient is not nervous/anxious.   ? ?Last CBC ?Lab Results  ?Component Value Date  ? WBC 5.0 02/13/2022  ? HGB 13.5 02/13/2022  ? HCT 39.3 02/13/2022  ? MCV 95 02/13/2022  ? MCH 32.5 02/13/2022  ? RDW 12.7 02/13/2022  ? PLT 168 02/13/2022  ? ?Last metabolic panel ?Lab Results  ?Component Value Date  ? GLUCOSE 85 02/13/2022  ? NA 141 02/13/2022  ? K 4.1 02/13/2022  ? CL 101 02/13/2022  ? CO2 24 02/13/2022  ? BUN 9 02/13/2022  ? CREATININE 0.87 02/13/2022  ? EGFR 77 02/13/2022  ? CALCIUM 9.2 02/13/2022  ? PROT 6.4 02/13/2022  ? ALBUMIN 4.4 02/13/2022  ? LABGLOB 2.0 02/13/2022  ? AGRATIO 2.2 02/13/2022  ? BILITOT 1.1 02/13/2022  ? ALKPHOS 94 02/13/2022  ? AST 23 02/13/2022  ? ALT 24 02/13/2022  ? ?Last lipids ?Lab Results  ?Component Value Date  ? CHOL 214 (H) 02/13/2022  ? HDL 41 02/13/2022  ? LDLCALC 144 (H) 02/13/2022  ? TRIG 161 (H) 02/13/2022  ? CHOLHDL 5.2 (H) 02/13/2022  ? ?Last hemoglobin A1c ?Lab Results  ?Component Value Date  ? HGBA1C 5.8 (H) 02/13/2022  ? ?Last thyroid functions ?Lab Results  ?Component Value Date  ? TSH 2.240 02/13/2022  ? ? ?  ? ? Objective  ?  ? ?Today's  Vitals  ? 03/31/22 1521  ?BP: 110/70  ?Pulse: 63  ?Temp: 98.4 ?F (36.9 ?C)  ?SpO2: 98%  ?Weight: 165 lb 14.4 oz (75.3 kg)  ?Height: 5' 4.17" (1.63 m)  ? ?Body mass index is 28.32 kg/m?.  ? ?BP Readings from Last 3 Encounters:  ?03/31/22 110/70  ?02/13/22 107/68  ?01/16/22 (!) 165/79  ?  ?Wt Readings from Last 3 Encounters:  ?03/31/22 165 lb 14.4 oz (75.3 kg)  ?02/13/22 166 lb 12.8 oz (75.7 kg)  ?01/16/22 173 lb (78.5 kg)  ?  ?Physical Exam ?Vitals and nursing note reviewed. Exam conducted  with a chaperone present.  ?Constitutional:   ?   Appearance: Normal appearance. She is well-developed.  ?HENT:  ?   Head: Normocephalic and atraumatic.  ?   Right Ear: Tympanic membrane, ear canal and external ear normal.  ?   Left Ear: Tympanic membrane, ear canal and external ear normal.  ?   Nose: Nose normal.  ?   Mouth/Throat:  ?   Mouth: Mucous membranes are moist.  ?   Pharynx: Oropharynx is clear.  ?Eyes:  ?   Extraocular Movements: Extraocular movements intact.  ?   Conjunctiva/sclera: Conjunctivae normal.  ?   Pupils: Pupils are equal, round, and reactive to light.  ?Neck:  ?   Vascular: No carotid bruit.  ?Cardiovascular:  ?   Rate and Rhythm: Normal rate and regular rhythm.  ?   Pulses: Normal pulses.  ?   Heart sounds: Normal heart sounds.  ?Pulmonary:  ?   Effort: Pulmonary effort is normal.  ?   Breath sounds: Normal breath sounds.  ?Chest:  ?Breasts: ?   Right: Normal. No swelling, bleeding, inverted nipple, mass, nipple discharge, skin change or tenderness.  ?   Left: Normal. No swelling, bleeding, inverted nipple, mass, nipple discharge, skin change or tenderness.  ?Abdominal:  ?   General: Bowel sounds are normal. There is no distension.  ?   Palpations: Abdomen is soft. There is no mass.  ?   Tenderness: There is no abdominal tenderness. There is no right CVA tenderness, left CVA tenderness, guarding or rebound.  ?   Hernia: No hernia is present. There is no hernia in the right inguinal area.  ?Genitourinary: ?   General: Normal vulva.  ?   Exam position: Supine.  ?   Labia:     ?   Right: No rash, tenderness, lesion or injury.     ?   Left: No rash, tenderness, lesion or injury.   ?   Vagina: Normal. No signs of injury and foreign body. No vaginal discharge, erythema, tenderness, bleeding, lesions or prolapsed vaginal walls.  ?   Cervix: No cervical motion tenderness, discharge, friability, lesion, erythema, cervical bleeding or eversion.  ?   Uterus: Normal.   ?   Adnexa: Right adnexa normal  and left adnexa normal.  ?   Comments: No tenderness, masses, or organomeglay present during bimanual exam . ?  ?Musculoskeletal:     ?   General: Normal range of motion.  ?   Cervical back: Normal range of motion and neck supple.  ?Lymphadenopathy:  ?   Cervical: No cervical adenopathy.  ?   Upper Body:  ?   Right upper body: No axillary adenopathy.  ?   Left upper body: No axillary adenopathy.  ?   Lower Body: No right inguinal adenopathy.  ?Skin: ?   General: Skin is warm and dry.  ?   Capillary  Refill: Capillary refill takes less than 2 seconds.  ?Neurological:  ?   General: No focal deficit present.  ?   Mental Status: She is alert and oriented to person, place, and time.  ?Psychiatric:     ?   Mood and Affect: Mood normal.     ?   Behavior: Behavior normal.     ?   Thought Content: Thought content normal.     ?   Judgment: Judgment normal.  ?  ? ? Assessment & Plan  ?  ?1. Well woman exam ?Annual wellness visit today.  Pap smear obtained. ?- Cytology - PAP( [facility]) ? ?2. Essential hypertension ?Improved blood pressure improved.  Continue lisinopril and hydrochlorothiazide as prescribed. ?- lisinopril (ZESTRIL) 40 MG tablet; Take 1 tablet (40 mg total) by mouth daily.  Dispense: 90 tablet; Refill: 1 ? ?3. Mixed hyperlipidemia ?Reviewed labs mild, generalized elevation. Recommend patient limit intake of fried and fatty foods. He should increase intake of lean proteins and green leafy vegetables. Adding exercise into daily routine will also be beneficial.  Recheck lipid panel in 6 months and treat as indicated. ? ?4. Gastroesophageal reflux disease without esophagitis ?Improved.  Continue omeprazole as prescribed. ? ?5. Body mass index 28.0-28.9, adult ?Discussed lowering calorie intake to 1500 calories per day and incorporating exercise into daily routine to help lose weight.  ? ?6. Need for Tdap vaccination ?Tdap vaccine administered during today's visit.   ?- Tdap vaccine greater than or equal to 7yo IM   ? ? ?Problem List Items Addressed This Visit   ? ?  ? Cardiovascular and Mediastinum  ? Essential hypertension  ? Relevant Medications  ? lisinopril (ZESTRIL) 40 MG tablet  ?  ? Digestive  ? Lanna Poche

## 2022-04-05 DIAGNOSIS — E782 Mixed hyperlipidemia: Secondary | ICD-10-CM | POA: Insufficient documentation

## 2022-04-05 DIAGNOSIS — E785 Hyperlipidemia, unspecified: Secondary | ICD-10-CM | POA: Insufficient documentation

## 2022-04-06 LAB — CYTOLOGY - PAP
Comment: NEGATIVE
Diagnosis: REACTIVE
High risk HPV: NEGATIVE

## 2022-04-08 ENCOUNTER — Encounter: Payer: Self-pay | Admitting: Nurse Practitioner

## 2022-05-07 ENCOUNTER — Other Ambulatory Visit: Payer: Self-pay | Admitting: Nurse Practitioner

## 2022-05-07 ENCOUNTER — Ambulatory Visit (INDEPENDENT_AMBULATORY_CARE_PROVIDER_SITE_OTHER): Payer: 59

## 2022-05-07 ENCOUNTER — Telehealth: Payer: Self-pay | Admitting: Nurse Practitioner

## 2022-05-07 DIAGNOSIS — Z1231 Encounter for screening mammogram for malignant neoplasm of breast: Secondary | ICD-10-CM | POA: Diagnosis not present

## 2022-05-07 DIAGNOSIS — I10 Essential (primary) hypertension: Secondary | ICD-10-CM

## 2022-05-07 MED ORDER — HYDROCHLOROTHIAZIDE 25 MG PO TABS: 12.5000 mg | ORAL_TABLET | Freq: Every day | ORAL | 1 refills | Status: DC

## 2022-05-07 NOTE — Telephone Encounter (Signed)
Sent 90 day prescription to [facility] [address]. She has vollow up 07/2022.

## 2022-05-07 NOTE — Telephone Encounter (Signed)
Patient requesting a refill on her fluid medication and request a 90 day supply. Please advise.

## 2022-05-07 NOTE — Telephone Encounter (Signed)
Called pt LVM stating that her Rx was sent

## 2022-05-12 NOTE — Progress Notes (Signed)
Negative mammogram. Repeat in one year

## 2022-05-25 ENCOUNTER — Telehealth: Payer: Self-pay | Admitting: Nurse Practitioner

## 2022-05-25 NOTE — Telephone Encounter (Signed)
Patient requesting refill of Omeprazole. Please advise.  

## 2022-05-26 ENCOUNTER — Other Ambulatory Visit: Payer: Self-pay

## 2022-05-26 DIAGNOSIS — K219 Gastro-esophageal reflux disease without esophagitis: Secondary | ICD-10-CM

## 2022-05-26 MED ORDER — OMEPRAZOLE 40 MG PO CPDR: 40.0000 mg | DELAYED_RELEASE_CAPSULE | Freq: Every day | ORAL | 2 refills | Status: DC

## 2022-07-21 ENCOUNTER — Other Ambulatory Visit: Payer: 59

## 2022-07-21 ENCOUNTER — Other Ambulatory Visit: Payer: Self-pay

## 2022-07-21 DIAGNOSIS — E782 Mixed hyperlipidemia: Secondary | ICD-10-CM

## 2022-07-22 ENCOUNTER — Other Ambulatory Visit: Payer: 59

## 2022-07-22 DIAGNOSIS — E782 Mixed hyperlipidemia: Secondary | ICD-10-CM

## 2022-07-23 ENCOUNTER — Other Ambulatory Visit: Payer: Self-pay | Admitting: Nurse Practitioner

## 2022-07-23 DIAGNOSIS — I10 Essential (primary) hypertension: Secondary | ICD-10-CM

## 2022-07-23 LAB — LIPID PANEL
Chol/HDL Ratio: 5.8 ratio — ABNORMAL HIGH (ref 0.0–4.4)
Cholesterol, Total: 197 mg/dL (ref 100–199)
HDL: 34 mg/dL — ABNORMAL LOW (ref >39)
LDL Chol Calc (NIH): 116 mg/dL — ABNORMAL HIGH (ref 0–99)
Triglycerides: 267 mg/dL — ABNORMAL HIGH (ref 0–149)
VLDL Cholesterol Cal: 47 mg/dL — ABNORMAL HIGH (ref 5–40)

## 2022-07-23 NOTE — Progress Notes (Signed)
Lipid panel essentially unchanged. Review at visit 07/29/2022

## 2022-07-27 NOTE — Progress Notes (Deleted)
Established patient visit   Patient: Kimberly Bush   DOB: 09-Sep-1961   61 y.o. Female  MRN: [id number] Visit Date: 07/28/2022   No chief complaint on file.  Subjective    HPI  Follow up blood pressure.  -generally well managed on current medicaiton -recent lipid panel shows elevated triglycerides, slightly improved total cholesterol and unchanged LDL. The HDL.total cholesterol ratio is slightly higher this time. ?fasting and for how long.    Medications: Outpatient Medications Prior to Visit  Medication Sig   hydrochlorothiazide (HYDRODIURIL) 25 MG tablet Take 0.5-1 tablets (12.5-25 mg total) by mouth daily.   lisinopril (ZESTRIL) 40 MG tablet Take 1 tablet by mouth once daily   omeprazole (PRILOSEC) 40 MG capsule Take 1 capsule (40 mg total) by mouth daily.   potassium chloride SA (K-DUR,KLOR-CON) 20 MEQ tablet Take 1 tablet (20 mEq total) by mouth 2 (two) times daily.   No facility-administered medications prior to visit.    Review of Systems  Last lipids Lab Results  Component Value Date   CHOL 197 07/22/2022   HDL 34 (L) 07/22/2022   LDLCALC 116 (H) 07/22/2022   TRIG 267 (H) 07/22/2022   CHOLHDL 5.8 (H) 07/22/2022       Objective    There were no vitals taken for this visit. BP Readings from Last 3 Encounters:  03/31/22 110/70  02/13/22 107/68  01/16/22 (!) 165/79    Wt Readings from Last 3 Encounters:  03/31/22 165 lb 14.4 oz (75.3 kg)  02/13/22 166 lb 12.8 oz (75.7 kg)  01/16/22 173 lb (78.5 kg)    Physical Exam  ***  No results found for any visits on 07/28/22.  Assessment & Plan     Problem List Items Addressed This Visit   None    No follow-ups on file.         Carlean Jews, NP  [facility] at [facility] [phone] (phone) [phone] (fax)  [facility]

## 2022-07-28 ENCOUNTER — Ambulatory Visit: Payer: 59 | Admitting: Nurse Practitioner

## 2022-08-06 ENCOUNTER — Encounter: Payer: Self-pay | Admitting: Nurse Practitioner

## 2022-08-06 ENCOUNTER — Telehealth: Payer: Self-pay | Admitting: Nurse Practitioner

## 2022-08-06 ENCOUNTER — Ambulatory Visit (INDEPENDENT_AMBULATORY_CARE_PROVIDER_SITE_OTHER): Payer: 59 | Admitting: Nurse Practitioner

## 2022-08-06 VITALS — BP 111/74 | HR 65 | Ht 64.17 in | Wt 165.0 lb

## 2022-08-06 DIAGNOSIS — K219 Gastro-esophageal reflux disease without esophagitis: Secondary | ICD-10-CM

## 2022-08-06 DIAGNOSIS — I10 Essential (primary) hypertension: Secondary | ICD-10-CM

## 2022-08-06 DIAGNOSIS — Z23 Encounter for immunization: Secondary | ICD-10-CM | POA: Diagnosis not present

## 2022-08-06 DIAGNOSIS — E785 Hyperlipidemia, unspecified: Secondary | ICD-10-CM

## 2022-08-06 MED ORDER — LISINOPRIL 40 MG PO TABS: 40.0000 mg | ORAL_TABLET | Freq: Every day | ORAL | 1 refills | Status: DC

## 2022-08-06 MED ORDER — OMEPRAZOLE 40 MG PO CPDR: 40.0000 mg | DELAYED_RELEASE_CAPSULE | Freq: Every day | ORAL | 1 refills | Status: DC

## 2022-08-06 NOTE — Progress Notes (Signed)
Established patient visit   Patient: Kimberly Bush   DOB: 1961/08/15   61 y.o. Female  MRN: [id number] Visit Date: 08/06/2022   Chief Complaint  Patient presents with   Follow-up   Subjective    HPI  Follow up.  Hypertension  Hyperlipidemia -fasting lipids retested prior to this visit. Slightly more elevated than original test.  Increased acid reflux   Medications: Outpatient Medications Prior to Visit  Medication Sig   hydrochlorothiazide (HYDRODIURIL) 25 MG tablet Take 0.5-1 tablets (12.5-25 mg total) by mouth daily.   [DISCONTINUED] lisinopril (ZESTRIL) 40 MG tablet Take 1 tablet by mouth once daily   [DISCONTINUED] omeprazole (PRILOSEC) 40 MG capsule Take 1 capsule (40 mg total) by mouth daily. (Patient not taking: Reported on 08/06/2022)   [DISCONTINUED] potassium chloride SA (K-DUR,KLOR-CON) 20 MEQ tablet Take 1 tablet (20 mEq total) by mouth 2 (two) times daily.   No facility-administered medications prior to visit.    Review of Systems  Constitutional:  Negative for activity change, appetite change, chills, fatigue and fever.  HENT:  Negative for congestion, postnasal drip, rhinorrhea, sinus pressure, sinus pain, sneezing and sore throat.   Eyes: Negative.   Respiratory:  Negative for cough, chest tightness, shortness of breath and wheezing.   Cardiovascular:  Negative for chest pain and palpitations.  Gastrointestinal:  Negative for abdominal pain, constipation, diarrhea, nausea and vomiting.       Increased GERD   Endocrine: Negative for cold intolerance, heat intolerance, polydipsia and polyuria.  Genitourinary:  Negative for dyspareunia, dysuria, flank pain, frequency and urgency.  Musculoskeletal:  Negative for arthralgias, back pain and myalgias.  Skin:  Negative for rash.  Allergic/Immunologic: Negative for environmental allergies.  Neurological:  Negative for dizziness, weakness and headaches.  Hematological:  Negative for adenopathy.  Psychiatric/Behavioral:   The patient is not nervous/anxious.     Last lipids Lab Results  Component Value Date   CHOL 197 07/22/2022   HDL 34 (L) 07/22/2022   LDLCALC 116 (H) 07/22/2022   TRIG 267 (H) 07/22/2022   CHOLHDL 5.8 (H) 07/22/2022       Objective     Today's Vitals   08/06/22 1043  BP: 111/74  Pulse: 65  SpO2: 98%  Weight: 165 lb (74.8 kg)  Height: 5' 4.17" (1.63 m)   Body mass index is 28.17 kg/m.   BP Readings from Last 3 Encounters:  08/06/22 111/74  03/31/22 110/70  02/13/22 107/68    Wt Readings from Last 3 Encounters:  08/06/22 165 lb (74.8 kg)  03/31/22 165 lb 14.4 oz (75.3 kg)  02/13/22 166 lb 12.8 oz (75.7 kg)    Physical Exam Vitals and nursing note reviewed.  Constitutional:      Appearance: Normal appearance. She is well-developed.  HENT:     Head: Normocephalic and atraumatic.     Nose: Nose normal.     Mouth/Throat:     Mouth: Mucous membranes are moist.     Pharynx: Oropharynx is clear.  Eyes:     Extraocular Movements: Extraocular movements intact.     Conjunctiva/sclera: Conjunctivae normal.     Pupils: Pupils are equal, round, and reactive to light.  Cardiovascular:     Rate and Rhythm: Normal rate and regular rhythm.     Pulses: Normal pulses.     Heart sounds: Normal heart sounds.  Pulmonary:     Effort: Pulmonary effort is normal.     Breath sounds: Normal breath sounds.  Abdominal:     Palpations: Abdomen  is soft.  Musculoskeletal:        General: Normal range of motion.     Cervical back: Normal range of motion and neck supple.  Lymphadenopathy:     Cervical: No cervical adenopathy.  Skin:    General: Skin is warm and dry.     Capillary Refill: Capillary refill takes less than 2 seconds.  Neurological:     General: No focal deficit present.     Mental Status: She is alert and oriented to person, place, and time.  Psychiatric:        Mood and Affect: Mood normal.        Behavior: Behavior normal.        Thought Content: Thought content  normal.        Judgment: Judgment normal.       Assessment & Plan    1. Essential hypertension Stable. Continue lisinipril as prescribed  - lisinopril (ZESTRIL) 40 MG tablet; Take 1 tablet (40 mg total) by mouth daily.  Dispense: 90 tablet; Refill: 1  2. Gastroesophageal reflux disease without esophagitis May take omeprazole 40 mg daily as needed  - omeprazole (PRILOSEC) 40 MG capsule; Take 1 capsule (40 mg total) by mouth daily.  Dispense: 90 capsule; Refill: 1  3. Hyperlipidemia LDL goal <100 Reviewed labs. LDL 116. Recommend patient limit intake of fried and fatty foods. She should increase intake of lean proteins and green leafy vegetables. Adding exercise into daily routine will also be beneficial.  Recheck prior  to next visit and treat as indicted.   4. Need for shingles vaccine Initial  shingles vaccine administered during today's visit.  - Varicella-zoster vaccine IM   Problem List Items Addressed This Visit       Cardiovascular and Mediastinum   Essential hypertension - Primary   Relevant Medications   lisinopril (ZESTRIL) 40 MG tablet     Digestive   Gastroesophageal reflux disease without esophagitis   Relevant Medications   omeprazole (PRILOSEC) 40 MG capsule   Other Visit Diagnoses     Hyperlipidemia LDL goal <100       Relevant Medications   lisinopril (ZESTRIL) 40 MG tablet   Need for shingles vaccine       Relevant Orders   Varicella-zoster vaccine IM (Completed)        Return in about 4 months (around 12/06/2022) for blood pressure, HLD. fasting lipids and BMP a week prior. 2nd shingles. will need flu when avail.         Carlean Jews, NP  [facility] at [facility] [phone] (phone) [phone] (fax)  [facility]

## 2022-08-17 NOTE — Addendum Note (Signed)
Addended by: Vincent Gros on: 08/17/2022 08:05 PM   Modules accepted: Orders, Level of Service

## 2022-09-07 LAB — COLOGUARD: COLOGUARD: NEGATIVE

## 2022-09-14 ENCOUNTER — Ambulatory Visit
Admission: EM | Admit: 2022-09-14 | Discharge: 2022-09-14 | Disposition: A | Discharge status: Home / Self Care | Payer: Commercial Managed Care - HMO | Attending: Internal Medicine | Admitting: Internal Medicine

## 2022-09-14 ENCOUNTER — Telehealth: Payer: Self-pay

## 2022-09-14 DIAGNOSIS — R21 Rash and other nonspecific skin eruption: Secondary | ICD-10-CM

## 2022-09-14 MED ORDER — TRIAMCINOLONE ACETONIDE 0.1 % EX CREA: 1.0000 | TOPICAL_CREAM | Freq: Two times a day (BID) | CUTANEOUS | 0 refills | Status: DC

## 2022-09-14 NOTE — ED Provider Notes (Signed)
[facility]    CSN: [id number] Arrival date & time: 09/14/22  1323      History   Chief Complaint Chief Complaint  Patient presents with   Rash    HPI Kimberly Bush is a 61 y.o. female.   Patient presents with itchy rash present to left antecubital spaces of elbows that has been present for about a month.  Denies changes to lotions, soaps, detergents, foods, etc.  Denies any associated fever.  Patient has used calamine lotion and taken Benadryl with minimal improvement.   Rash   Past Medical History:  Diagnosis Date   Hypertension     Patient Active Problem List   Diagnosis Date Noted   Mixed hyperlipidemia 04/05/2022   Gastroesophageal reflux disease without esophagitis 02/21/2022   Essential hypertension 01/16/2022   Body mass index 29.0-29.9, adult 01/16/2022   Family history of early CAD 01/16/2022    Past Surgical History:  Procedure Laterality Date   BACK SURGERY      OB History   No obstetric history on file.      Home Medications    Prior to Admission medications   Medication Sig Start Date End Date Taking? Authorizing Provider  triamcinolone cream (KENALOG) 0.1 % Apply 1 Application topically 2 (two) times daily. 09/14/22  Yes Brandan Glauber, Rolly Salter E, FNP  hydrochlorothiazide (HYDRODIURIL) 25 MG tablet Take 0.5-1 tablets (12.5-25 mg total) by mouth daily. 05/07/22   Carlean Jews, NP  lisinopril (ZESTRIL) 40 MG tablet Take 1 tablet (40 mg total) by mouth daily. 08/06/22   Carlean Jews, NP  omeprazole (PRILOSEC) 40 MG capsule Take 1 capsule (40 mg total) by mouth daily. 08/06/22   Carlean Jews, NP    Family History Family History  Problem Relation Age of Onset   Hypertension Mother     Social History Social History   Tobacco Use   Smoking status: Never  Substance Use Topics   Alcohol use: Yes    Comment: social   Drug use: No     Allergies   Prednisone   Review of Systems Review of Systems Per HPI  Physical  Exam Triage Vital Signs ED Triage Vitals  Enc Vitals Group     BP 09/14/22 1341 127/81     Pulse Rate 09/14/22 1341 86     Resp 09/14/22 1341 16     Temp 09/14/22 1341 98 F (36.7 C)     Temp Source 09/14/22 1341 Oral     SpO2 09/14/22 1341 98 %     Weight --      Height --      Head Circumference --      Peak Flow --      Pain Score 09/14/22 1342 0     Pain Loc --      Pain Edu? --      Excl. in GC? --    No data found.  Updated Vital Signs BP 127/81 (BP Location: Left Arm)   Pulse 86   Temp 98 F (36.7 C) (Oral)   Resp 16   SpO2 98%   Visual Acuity Right Eye Distance:   Left Eye Distance:   Bilateral Distance:    Right Eye Near:   Left Eye Near:    Bilateral Near:     Physical Exam Constitutional:      General: She is not in acute distress.    Appearance: Normal appearance. She is not toxic-appearing or diaphoretic.  HENT:  Head: Normocephalic and atraumatic.  Eyes:     Extraocular Movements: Extraocular movements intact.     Conjunctiva/sclera: Conjunctivae normal.  Pulmonary:     Effort: Pulmonary effort is normal.  Skin:    Comments: Patient has mildly scaly, erythematous, flat rash present to bilateral antecubital spaces of elbows.  Neurological:     General: No focal deficit present.     Mental Status: She is alert and oriented to person, place, and time. Mental status is at baseline.  Psychiatric:        Mood and Affect: Mood normal.        Behavior: Behavior normal.        Thought Content: Thought content normal.        Judgment: Judgment normal.      UC Treatments / Results  Labs (all labs ordered are listed, but only abnormal results are displayed) Labs Reviewed - No data to display  EKG   Radiology No results found.  Procedures Procedures (including critical care time)  Medications Ordered in UC Medications - No data to display  Initial Impression / Assessment and Plan / UC Course  I have reviewed the triage vital signs  and the nursing notes.  Pertinent labs & imaging results that were available during my care of the patient were reviewed by me and considered in my medical decision making (see chart for details).     Differential diagnoses for rash include eczema versus contact dermatitis.  Patient denies any history of eczema so there is low suspicion of this.  Rash does appear allergic in nature so will treat with triamcinolone cream.  Patient has allergy to prednisone that causes her to feel "overheated" but denies any itching or shortness of breath associated with prednisone so I do think that local triamcinolone will be safe.  Patient advised to continue antihistamine.  Patient advised to follow-up if symptoms persist or worsen.  Patient verbalized understanding and was agreeable with plan. Final Clinical Impressions(s) / UC Diagnoses   Final diagnoses:  Rash and nonspecific skin eruption     Discharge Instructions      You have been prescribed a cream to apply directly to area.  Please follow-up if symptoms persist or worsen.    ED Prescriptions     Medication Sig Dispense Auth. Provider   triamcinolone cream (KENALOG) 0.1 % Apply 1 Application topically 2 (two) times daily. 30 g Teodora Medici, [place]      PDMP not reviewed this encounter.   Teodora Medici, [place] 09/14/22 1401

## 2022-09-14 NOTE — ED Triage Notes (Signed)
Pt c/o bilat upper extremity rash in each [facility] regions. States they are pruritic. Onset ~ 1 month ago. Denies pain in triage.

## 2022-09-14 NOTE — Discharge Instructions (Signed)
You have been prescribed a cream to apply directly to area.  Please follow-up if symptoms persist or worsen.

## 2022-09-14 NOTE — Telephone Encounter (Signed)
Yes agreed. What does she thinnk the allergic reaction is to?

## 2022-09-14 NOTE — Telephone Encounter (Signed)
Patient came into office due to her having a allergic reaction on both arms, patient states at night the allergic reaction isnt that bad, but during the day it gets worst, patient would like to know what she should do, please advise, thanks!

## 2022-09-14 NOTE — Progress Notes (Signed)
Negative cologuard. Repeat tet in three years

## 2022-09-23 NOTE — Telephone Encounter (Signed)
Note not needed 

## 2022-10-12 ENCOUNTER — Other Ambulatory Visit: Payer: Self-pay | Admitting: Nurse Practitioner

## 2022-10-12 DIAGNOSIS — I10 Essential (primary) hypertension: Secondary | ICD-10-CM

## 2022-11-26 ENCOUNTER — Other Ambulatory Visit: Payer: Self-pay

## 2022-11-26 DIAGNOSIS — E782 Mixed hyperlipidemia: Secondary | ICD-10-CM

## 2022-11-26 DIAGNOSIS — Z0000 Encounter for general adult medical examination without abnormal findings: Secondary | ICD-10-CM

## 2022-11-26 DIAGNOSIS — I10 Essential (primary) hypertension: Secondary | ICD-10-CM

## 2022-11-30 ENCOUNTER — Other Ambulatory Visit: Payer: Self-pay | Admitting: Nurse Practitioner

## 2022-11-30 ENCOUNTER — Other Ambulatory Visit: Payer: Commercial Managed Care - HMO

## 2022-11-30 DIAGNOSIS — E785 Hyperlipidemia, unspecified: Secondary | ICD-10-CM

## 2022-11-30 DIAGNOSIS — I10 Essential (primary) hypertension: Secondary | ICD-10-CM

## 2022-12-01 LAB — BASIC METABOLIC PANEL
BUN/Creatinine Ratio: 11 (ref 9–23)
BUN: 11 mg/dL (ref 6–24)
CO2: 24 mmol/L (ref 20–29)
Calcium: 9.8 mg/dL (ref 8.7–10.2)
Chloride: 104 mmol/L (ref 96–106)
Creatinine, Ser: 0.98 mg/dL (ref 0.57–1.00)
Glucose: 100 mg/dL — ABNORMAL HIGH (ref 70–99)
Potassium: 4 mmol/L (ref 3.5–5.2)
Sodium: 143 mmol/L (ref 134–144)
eGFR: 66 mL/min/{1.73_m2} (ref >59)

## 2022-12-01 LAB — LIPID PANEL
Chol/HDL Ratio: 5.6 ratio — ABNORMAL HIGH (ref 0.0–4.4)
Cholesterol, Total: 209 mg/dL — ABNORMAL HIGH (ref 100–199)
HDL: 37 mg/dL — ABNORMAL LOW (ref >39)
LDL Chol Calc (NIH): 137 mg/dL — ABNORMAL HIGH (ref 0–99)
Triglycerides: 196 mg/dL — ABNORMAL HIGH (ref 0–149)
VLDL Cholesterol Cal: 35 mg/dL (ref 5–40)

## 2022-12-04 ENCOUNTER — Telehealth: Payer: Self-pay | Admitting: *Deleted

## 2022-12-04 ENCOUNTER — Encounter: Payer: Self-pay | Admitting: Nurse Practitioner

## 2022-12-04 ENCOUNTER — Other Ambulatory Visit: Payer: Self-pay | Admitting: Nurse Practitioner

## 2022-12-04 DIAGNOSIS — U071 COVID-19: Secondary | ICD-10-CM

## 2022-12-04 MED ORDER — NIRMATRELVIR/RITONAVIR (PAXLOVID)TABLET: 3.0000 | ORAL_TABLET | Freq: Two times a day (BID) | ORAL | 0 refills | Status: AC

## 2022-12-04 NOTE — Telephone Encounter (Signed)
Pt informed.Phillip Maffei Zimmerman Rumple, CMA ° °

## 2022-12-04 NOTE — Telephone Encounter (Signed)
Pt calling said she tested positive for covid today and wanted to know if she could have paxlovid called in.  She said symptoms started Wednesday, she thought allergies, yesterday she was achy and spouse is also positive. Dani Wallner Zimmerman Rumple, CMA

## 2022-12-04 NOTE — Telephone Encounter (Signed)
Please let the patient know that I sent the prescription for Paxlovid to the [facility] on [address]. Please advise her to also take OTC zinc, vitamin d, and vitamin c every day. Can also use otc medications as needed and as indicated for symptom management  Thanks so much.   -HB

## 2022-12-04 NOTE — Telephone Encounter (Signed)
Pt calling again about this, she wanted to know if we would call her when it is sent in or if she will be notified by pharmacy when it is ready. Brandyn Lowrey Zimmerman Rumple, CMA

## 2022-12-09 ENCOUNTER — Ambulatory Visit: Payer: 59 | Admitting: Nurse Practitioner

## 2022-12-28 IMAGING — MG MM DIGITAL SCREENING BILAT W/ TOMO AND CAD
8 series · 8 of 24 positions shown · non-contrast
Comparison: Previous exam(s).

CLINICAL DATA: Screening.

EXAM:
DIGITAL SCREENING BILATERAL MAMMOGRAM WITH TOMOSYNTHESIS AND CAD
TECHNIQUE: Bilateral screening digital craniocaudal and mediolateral oblique
mammograms were obtained. Bilateral screening digital breast
tomosynthesis was performed. The images were evaluated with
computer-aided detection.

[L CC synth-2D]
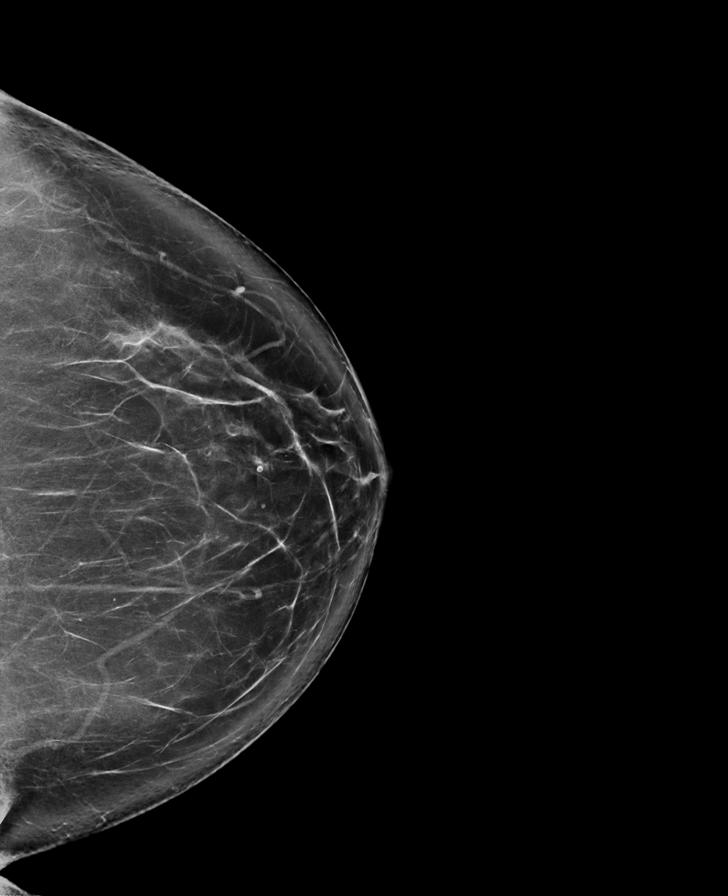

[R CC synth-2D]
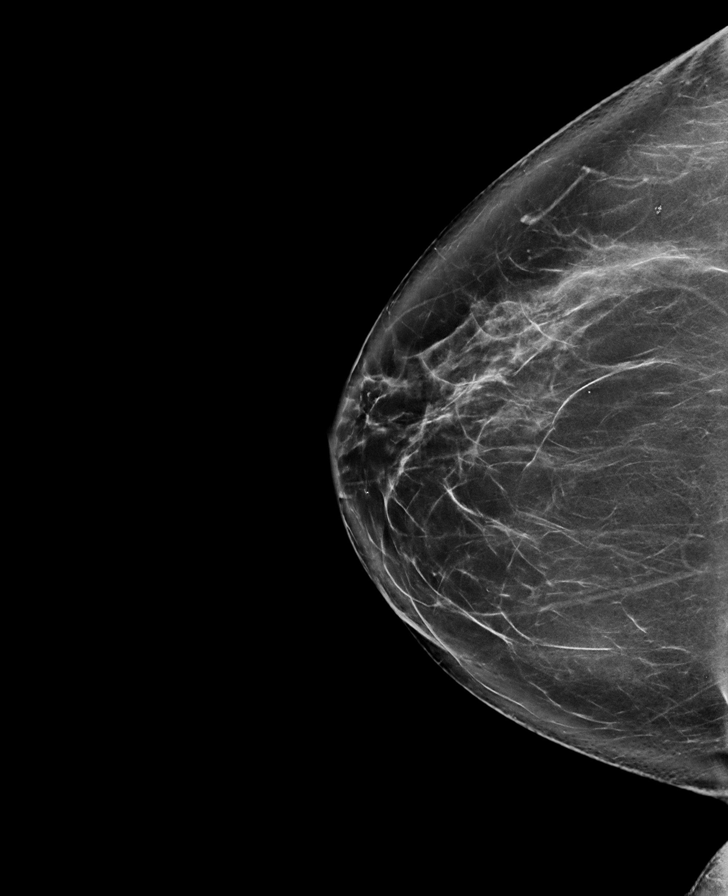

[R MLO synth-2D]
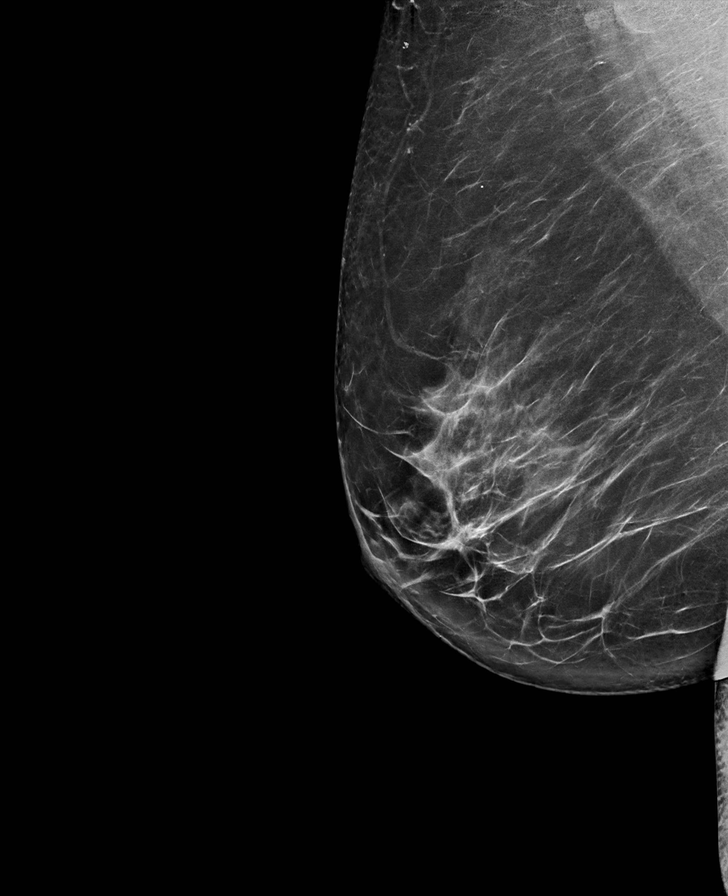

[L MLO synth-2D]
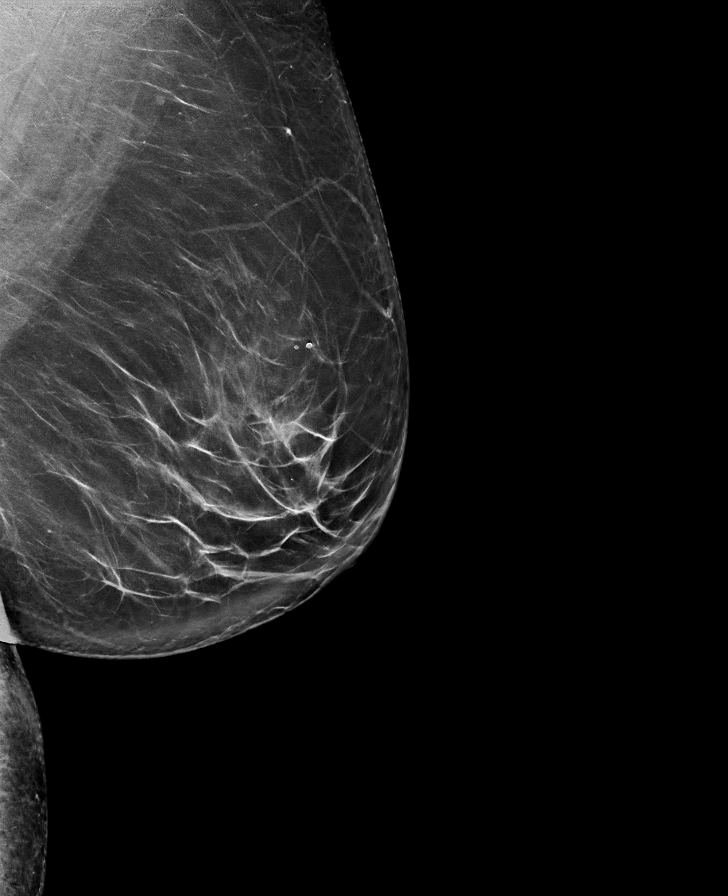

[R MLO tomo · tomo slice 45/89.0]
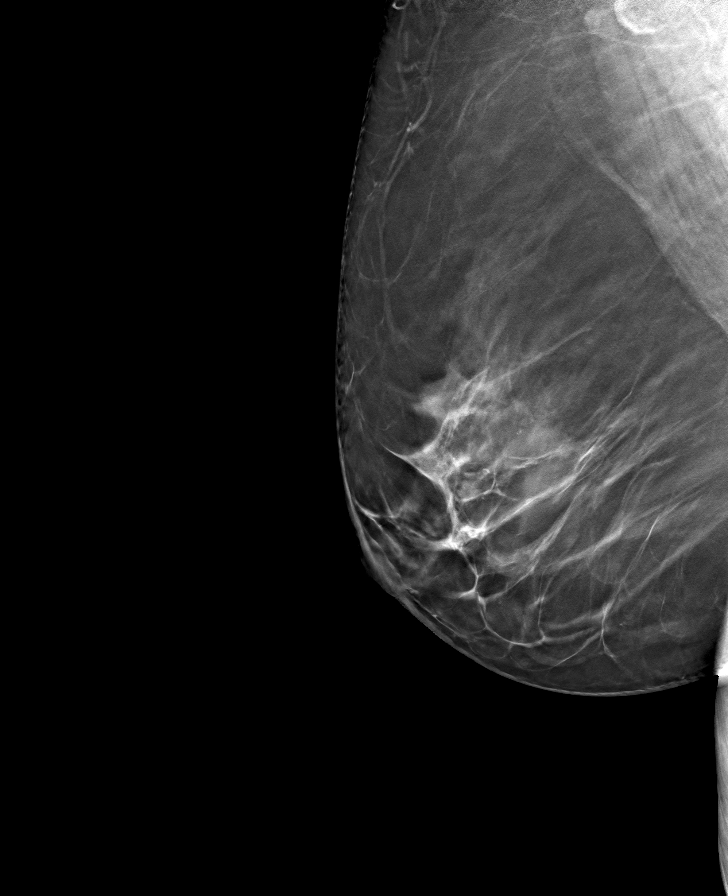

[L CC tomo · tomo slice 47/93.0]
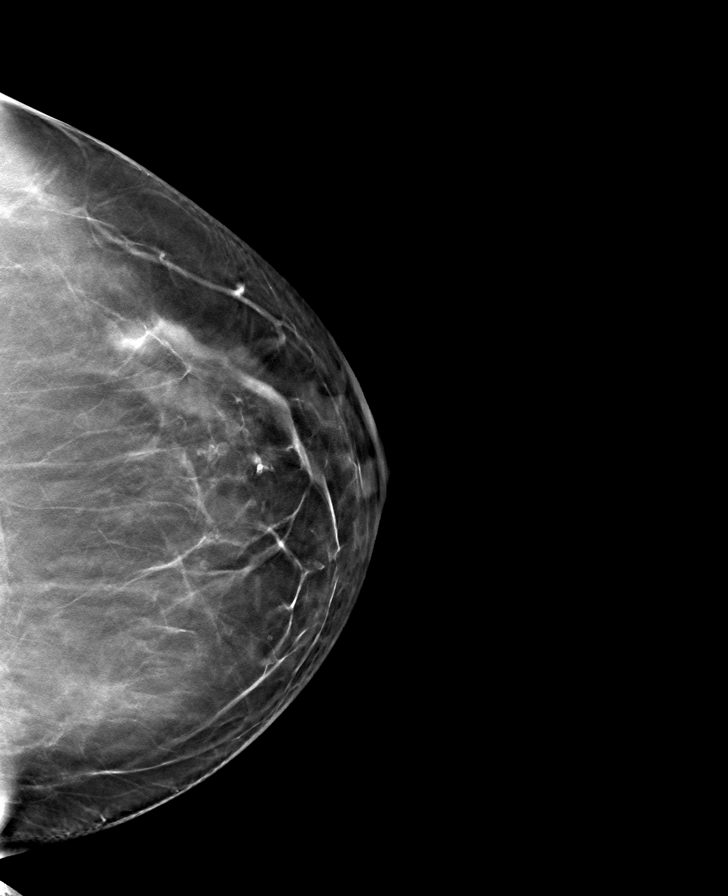

[R CC tomo · tomo slice 45/88.0]
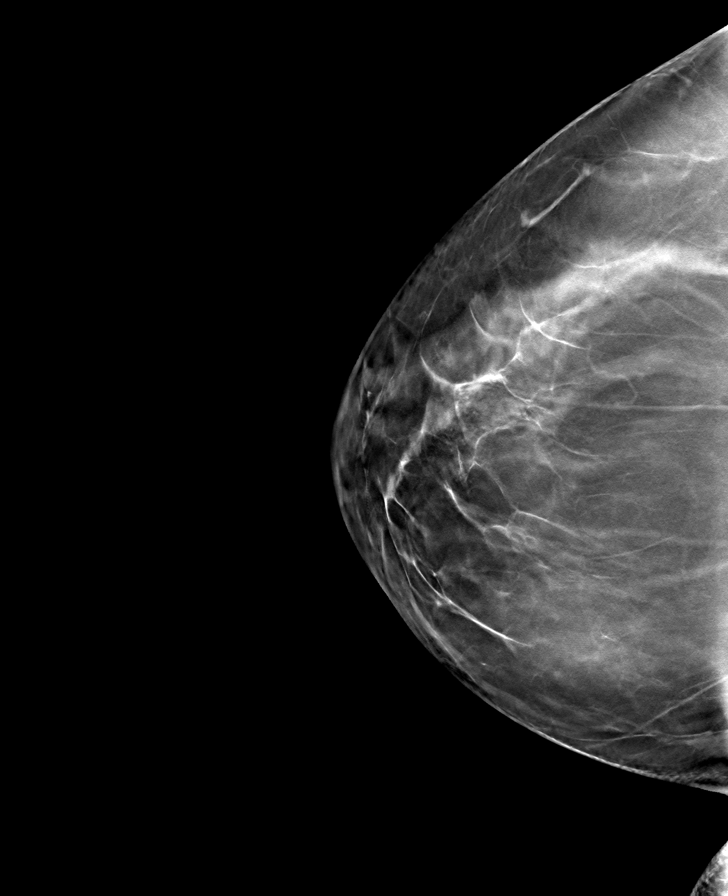

[L MLO tomo · tomo slice 44/87.0]
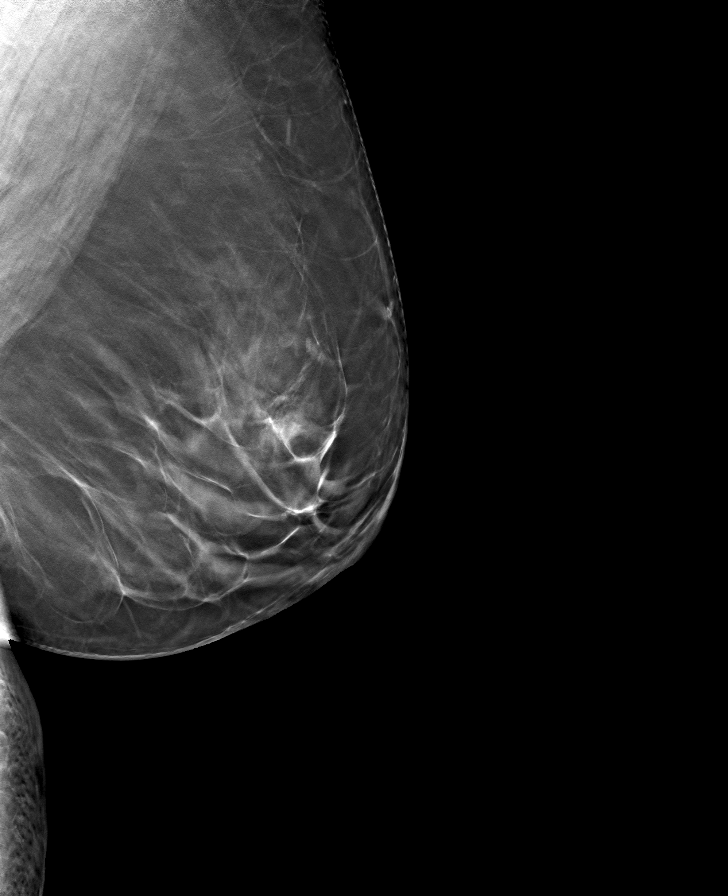

[8 of 24 positions shown; findings below may reference images not displayed]

ACR Breast Density Category b: There are scattered areas of
fibroglandular density.
FINDINGS: There are no findings suspicious for malignancy.
IMPRESSION: No mammographic evidence of malignancy. A result letter of this
screening mammogram will be mailed directly to the patient.

RECOMMENDATION:
Screening mammogram in one year. (Code:51-O-LD2)

BI-RADS CATEGORY  1: Negative.

## 2023-01-20 ENCOUNTER — Ambulatory Visit: Payer: 59 | Admitting: Nurse Practitioner

## 2023-01-20 VITALS — BP 113/77 | HR 67 | Ht 64.17 in | Wt 162.1 lb

## 2023-01-20 DIAGNOSIS — Z23 Encounter for immunization: Secondary | ICD-10-CM | POA: Diagnosis not present

## 2023-01-20 NOTE — Progress Notes (Signed)
Patient is here for her Shingle and Flu vax pt tolerated both injection well w/o complication

## 2023-02-04 ENCOUNTER — Other Ambulatory Visit: Payer: Self-pay

## 2023-02-04 DIAGNOSIS — E785 Hyperlipidemia, unspecified: Secondary | ICD-10-CM

## 2023-02-04 DIAGNOSIS — I10 Essential (primary) hypertension: Secondary | ICD-10-CM

## 2023-02-04 NOTE — Progress Notes (Signed)
bmp 

## 2023-02-08 ENCOUNTER — Other Ambulatory Visit: Payer: 59

## 2023-02-10 ENCOUNTER — Other Ambulatory Visit: Payer: 59

## 2023-02-10 DIAGNOSIS — I10 Essential (primary) hypertension: Secondary | ICD-10-CM

## 2023-02-10 DIAGNOSIS — E785 Hyperlipidemia, unspecified: Secondary | ICD-10-CM

## 2023-02-10 DIAGNOSIS — Z0000 Encounter for general adult medical examination without abnormal findings: Secondary | ICD-10-CM

## 2023-02-11 LAB — LIPID PANEL
Chol/HDL Ratio: 5.4 ratio — ABNORMAL HIGH (ref 0.0–4.4)
Cholesterol, Total: 216 mg/dL — ABNORMAL HIGH (ref 100–199)
HDL: 40 mg/dL (ref >39)
LDL Chol Calc (NIH): 151 mg/dL — ABNORMAL HIGH (ref 0–99)
Triglycerides: 140 mg/dL (ref 0–149)
VLDL Cholesterol Cal: 25 mg/dL (ref 5–40)

## 2023-02-11 LAB — BASIC METABOLIC PANEL
BUN/Creatinine Ratio: 10 (ref 9–23)
BUN: 10 mg/dL (ref 6–24)
CO2: 26 mmol/L (ref 20–29)
Calcium: 9.6 mg/dL (ref 8.7–10.2)
Chloride: 103 mmol/L (ref 96–106)
Creatinine, Ser: 1.02 mg/dL — ABNORMAL HIGH (ref 0.57–1.00)
Glucose: 95 mg/dL (ref 70–99)
Potassium: 4.5 mmol/L (ref 3.5–5.2)
Sodium: 142 mmol/L (ref 134–144)
eGFR: 63 mL/min/{1.73_m2} (ref >59)

## 2023-02-11 LAB — CBC WITH DIFFERENTIAL/PLATELET
Basophils Absolute: 0 10*3/uL (ref 0.0–0.2)
Basos: 1 %
EOS (ABSOLUTE): 0.1 10*3/uL (ref 0.0–0.4)
Eos: 3 %
Hematocrit: 40.1 % (ref 34.0–46.6)
Hemoglobin: 13.3 g/dL (ref 11.1–15.9)
Immature Grans (Abs): 0 10*3/uL (ref 0.0–0.1)
Immature Granulocytes: 0 %
Lymphocytes Absolute: 1 10*3/uL (ref 0.7–3.1)
Lymphs: 22 %
MCH: 31.7 pg (ref 26.6–33.0)
MCHC: 33.2 g/dL (ref 31.5–35.7)
MCV: 96 fL (ref 79–97)
Monocytes Absolute: 0.3 10*3/uL (ref 0.1–0.9)
Monocytes: 6 %
Neutrophils Absolute: 2.9 10*3/uL (ref 1.4–7.0)
Neutrophils: 68 %
Platelets: 178 10*3/uL (ref 150–450)
RBC: 4.2 x10E6/uL (ref 3.77–5.28)
RDW: 13 % (ref 11.7–15.4)
WBC: 4.4 10*3/uL (ref 3.4–10.8)

## 2023-02-11 LAB — HEMOGLOBIN A1C
Est. average glucose Bld gHb Est-mCnc: 114 mg/dL
Hgb A1c MFr Bld: 5.6 % (ref 4.8–5.6)

## 2023-02-11 LAB — TSH: TSH: 1.8 u[IU]/mL (ref 0.450–4.500)

## 2023-02-14 NOTE — Progress Notes (Unsigned)
Established patient visit   Patient: Kimberly Bush   DOB: Jun 24, 1961   62 y.o. Female  MRN: [id number] Visit Date: 02/15/2023   No chief complaint on file.  Subjective    HPI  Follow up  -hypertension  --generally well controlled -routine, fasting labs done prior  to  today's visit  --moderate elevation of LDL and mild elevation of total  cholesterol     Medications: Outpatient Medications Prior to Visit  Medication Sig   hydrochlorothiazide (HYDRODIURIL) 25 MG tablet TAKE 1/2 (ONE-HALF) TO 1 TABLET BY MOUTH ONCE DAILY   lisinopril (ZESTRIL) 40 MG tablet Take 1 tablet (40 mg total) by mouth daily.   omeprazole (PRILOSEC) 40 MG capsule Take 1 capsule (40 mg total) by mouth daily.   triamcinolone cream (KENALOG) 0.1 % Apply 1 Application topically 2 (two) times daily.   No facility-administered medications prior to visit.    Review of Systems  Last CBC Lab Results  Component Value Date   WBC 4.4 02/10/2023   HGB 13.3 02/10/2023   HCT 40.1 02/10/2023   MCV 96 02/10/2023   MCH 31.7 02/10/2023   RDW 13.0 02/10/2023   PLT 178 [id number]   Last metabolic panel Lab Results  Component Value Date   GLUCOSE 95 02/10/2023   NA 142 02/10/2023   K 4.5 02/10/2023   CL 103 02/10/2023   CO2 26 02/10/2023   BUN 10 02/10/2023   CREATININE 1.02 (H) 02/10/2023   EGFR 63 02/10/2023   CALCIUM 9.6 02/10/2023   PROT 6.4 02/13/2022   ALBUMIN 4.4 02/13/2022   LABGLOB 2.0 02/13/2022   AGRATIO 2.2 02/13/2022   BILITOT 1.1 02/13/2022   ALKPHOS 94 02/13/2022   AST 23 02/13/2022   ALT 24 02/13/2022   Last lipids Lab Results  Component Value Date   CHOL 216 (H) 02/10/2023   HDL 40 02/10/2023   LDLCALC 151 (H) 02/10/2023   TRIG 140 02/10/2023   CHOLHDL 5.4 (H) 02/10/2023   Last hemoglobin A1c Lab Results  Component Value Date   HGBA1C 5.6 02/10/2023   Last thyroid functions Lab Results  Component Value Date   TSH 1.800 02/10/2023        Objective    There were no  vitals filed for this visit. There is no height or weight on file to calculate BMI.  BP Readings from Last 3 Encounters:  01/20/23 113/77  09/14/22 127/81  08/06/22 111/74    Wt Readings from Last 3 Encounters:  01/20/23 162 lb 1.9 oz (73.5 kg)  08/06/22 165 lb (74.8 kg)  03/31/22 165 lb 14.4 oz (75.3 kg)    Physical Exam  ***  No results found for any visits on 02/15/23.  Assessment & Plan     Problem List Items Addressed This Visit   None    No follow-ups on file.         Ronnell Freshwater, NP  [facility] at [facility] [phone] (phone) [phone] (fax)  [place]

## 2023-02-15 ENCOUNTER — Ambulatory Visit (INDEPENDENT_AMBULATORY_CARE_PROVIDER_SITE_OTHER): Payer: 59 | Admitting: Nurse Practitioner

## 2023-02-15 ENCOUNTER — Encounter: Payer: Self-pay | Admitting: Nurse Practitioner

## 2023-02-15 VITALS — BP 115/80 | HR 60 | Ht 64.17 in | Wt 163.1 lb

## 2023-02-15 DIAGNOSIS — K219 Gastro-esophageal reflux disease without esophagitis: Secondary | ICD-10-CM | POA: Diagnosis not present

## 2023-02-15 DIAGNOSIS — E785 Hyperlipidemia, unspecified: Secondary | ICD-10-CM | POA: Diagnosis not present

## 2023-02-15 DIAGNOSIS — I10 Essential (primary) hypertension: Secondary | ICD-10-CM

## 2023-02-15 MED ORDER — LISINOPRIL 40 MG PO TABS: 40.0000 mg | ORAL_TABLET | Freq: Every day | ORAL | 1 refills | Status: DC

## 2023-02-15 MED ORDER — OMEPRAZOLE 40 MG PO CPDR: 40.0000 mg | DELAYED_RELEASE_CAPSULE | Freq: Every day | ORAL | 1 refills | Status: DC

## 2023-05-03 ENCOUNTER — Other Ambulatory Visit: Payer: Self-pay

## 2023-05-03 ENCOUNTER — Telehealth: Payer: Self-pay

## 2023-05-03 DIAGNOSIS — K219 Gastro-esophageal reflux disease without esophagitis: Secondary | ICD-10-CM

## 2023-05-03 DIAGNOSIS — I10 Essential (primary) hypertension: Secondary | ICD-10-CM

## 2023-05-03 MED ORDER — OMEPRAZOLE 40 MG PO CPDR: 40.0000 mg | DELAYED_RELEASE_CAPSULE | Freq: Every day | ORAL | 0 refills | Status: DC

## 2023-05-03 MED ORDER — HYDROCHLOROTHIAZIDE 25 MG PO TABS: ORAL_TABLET | ORAL | 0 refills | Status: DC

## 2023-05-03 MED ORDER — LISINOPRIL 40 MG PO TABS: 40.0000 mg | ORAL_TABLET | Freq: Every day | ORAL | 0 refills | Status: DC

## 2023-05-03 NOTE — Telephone Encounter (Signed)
All Rx's were sent to [facility]

## 2023-05-03 NOTE — Telephone Encounter (Signed)
Pt is requesting refills on: lisinopril (ZESTRIL) 40 MG tablet  omeprazole (PRILOSEC) 40 MG capsule  hydrochlorothiazide (HYDRODIURIL) 25 MG tablet   Pharmacy:  [facility] [id number] - [place],  - [address]   LOV 02/15/23 ROV 08/18/23  REQUESTING 90 DAY SUPPLY

## 2023-06-30 ENCOUNTER — Ambulatory Visit
Admission: EM | Admit: 2023-06-30 | Discharge: 2023-06-30 | Disposition: A | Discharge status: Home / Self Care | Payer: 59

## 2023-06-30 ENCOUNTER — Ambulatory Visit (INDEPENDENT_AMBULATORY_CARE_PROVIDER_SITE_OTHER): Payer: 59

## 2023-06-30 ENCOUNTER — Encounter: Payer: Self-pay | Admitting: *Deleted

## 2023-06-30 DIAGNOSIS — R21 Rash and other nonspecific skin eruption: Secondary | ICD-10-CM | POA: Diagnosis not present

## 2023-06-30 DIAGNOSIS — R053 Chronic cough: Secondary | ICD-10-CM

## 2023-06-30 HISTORY — DX: Other seasonal allergic rhinitis: J30.2

## 2023-06-30 MED ORDER — METHYLPREDNISOLONE 4 MG PO TBPK: ORAL_TABLET | ORAL | 0 refills | Status: DC

## 2023-06-30 MED ORDER — AZITHROMYCIN 250 MG PO TABS: ORAL_TABLET | ORAL | 0 refills | Status: AC

## 2023-06-30 NOTE — Discharge Instructions (Addendum)
I have prescribed you an antibiotic and a steroid.  Follow-up if any symptoms persist or worsen.  Referral to dermatology has also been placed.  If they do not call you in the next 48 to 72 hours, please call them yourself at provided contact information.

## 2023-06-30 NOTE — ED Provider Notes (Signed)
[facility]    CSN: [id number] Arrival date & time: 06/30/23  1200      History   Chief Complaint Chief Complaint  Patient presents with   Cough   Rash    HPI Kimberly Bush is a 62 y.o. female.   Patient presents today with 2 different chief complaints.  Patient reports that she has intermittent, itchy, "burning" rash present to bilateral antecubital spaces that started about 10 months ago.  Reports that she has been seen by urgent care and prescribed triamcinolone cream that provides temporary improvement.  Denies any known sick contacts.  Denies any changes in environment.  Patient also reports a dry cough that has been present for about 2 weeks.  Reports that she does have nasal congestion but states this is baseline for her allergies.  Patient has taken allergy medicine and cough medication with minimal improvement.  Denies chest pain or shortness of breath.  Denies history of asthma or COPD.  Patient reports that she smoked for approximately 1 year when she was in high school but denies any current cigarette smoking.  Denies fever.   Cough Rash   Past Medical History:  Diagnosis Date   Hypertension    Seasonal allergies     Patient Active Problem List   Diagnosis Date Noted   Hyperlipidemia LDL goal <100 04/05/2022   Gastroesophageal reflux disease without esophagitis 02/21/2022   Essential hypertension 01/16/2022   Body mass index 29.0-29.9, adult 01/16/2022   Family history of early CAD 01/16/2022    Past Surgical History:  Procedure Laterality Date   BACK SURGERY      OB History   No obstetric history on file.      Home Medications    Prior to Admission medications   Medication Sig Start Date End Date Taking? Authorizing Provider  azithromycin (ZITHROMAX Z-PAK) 250 MG tablet Take 2 tablets (500 mg total) by mouth daily for 1 day, THEN 1 tablet (250 mg total) daily for 4 days. 06/30/23 07/05/23 Yes Jennell Janosik, Rolly Salter E, FNP  Cetirizine HCl (ZYRTEC  PO) Take by mouth.   Yes [provider]  diphenhydrAMINE HCl (BENADRYL PO) Take by mouth.   Yes [provider]  lisinopril (ZESTRIL) 40 MG tablet Take 1 tablet (40 mg total) by mouth daily. 05/03/23  Yes Carlean Jews, NP  methylPREDNISolone (MEDROL DOSEPAK) 4 MG TBPK tablet Take per package instructions 06/30/23  Yes Blairs, [place] E, FNP  hydrochlorothiazide (HYDRODIURIL) 25 MG tablet TAKE 1/2 (ONE-HALF) TO 1 TABLET BY MOUTH ONCE DAILY 05/03/23   Carlean Jews, NP  omeprazole (PRILOSEC) 40 MG capsule Take 1 capsule (40 mg total) by mouth daily. 05/03/23   Carlean Jews, NP  triamcinolone cream (KENALOG) 0.1 % Apply 1 Application topically 2 (two) times daily. 09/14/22   Gustavus Bryant, FNP    Family History Family History  Problem Relation Age of Onset   Hypertension Mother     Social History Social History   Tobacco Use   Smoking status: Never  Vaping Use   Vaping status: Never Used  Substance Use Topics   Alcohol use: Yes    Comment: very occasionally   Drug use: No     Allergies   Prednisone   Review of Systems Review of Systems Per HPI  Physical Exam Triage Vital Signs ED Triage Vitals [06/30/23 1219]  Encounter Vitals Group     BP 128/88     Systolic BP Percentile  Diastolic BP Percentile      Pulse Rate 68     Resp 18     Temp 98.5 F (36.9 C)     Temp Source Oral     SpO2 96 %     Weight      Height      Head Circumference      Peak Flow      Pain Score      Pain Loc      Pain Education      Exclude from Growth Chart    No data found.  Updated Vital Signs BP 128/88   Pulse 68   Temp 98.5 F (36.9 C) (Oral)   Resp 18   LMP  (LMP Unknown)   SpO2 96%   Visual Acuity Right Eye Distance:   Left Eye Distance:   Bilateral Distance:    Right Eye Near:   Left Eye Near:    Bilateral Near:     Physical Exam Constitutional:      General: She is not in acute distress.    Appearance: Normal appearance. She is  not toxic-appearing or diaphoretic.  HENT:     Head: Normocephalic and atraumatic.     Right Ear: Tympanic membrane and ear canal normal.     Left Ear: Tympanic membrane and ear canal normal.     Nose: Congestion present.     Mouth/Throat:     Mouth: Mucous membranes are moist.     Pharynx: No posterior oropharyngeal erythema.  Eyes:     Extraocular Movements: Extraocular movements intact.     Conjunctiva/sclera: Conjunctivae normal.     Pupils: Pupils are equal, round, and reactive to light.  Cardiovascular:     Rate and Rhythm: Normal rate and regular rhythm.     Pulses: Normal pulses.     Heart sounds: Normal heart sounds.  Pulmonary:     Effort: Pulmonary effort is normal. No respiratory distress.     Breath sounds: Normal breath sounds. No stridor. No wheezing, rhonchi or rales.  Abdominal:     General: Abdomen is flat. Bowel sounds are normal.     Palpations: Abdomen is soft.  Musculoskeletal:        General: Normal range of motion.     Cervical back: Normal range of motion.  Skin:    General: Skin is warm and dry.     Comments: Patient has flat, erythematous rash present to bilateral antecubital spaces.  Neurological:     General: No focal deficit present.     Mental Status: She is alert and oriented to person, place, and time. Mental status is at baseline.  Psychiatric:        Mood and Affect: Mood normal.        Behavior: Behavior normal.      UC Treatments / Results  Labs (all labs ordered are listed, but only abnormal results are displayed) Labs Reviewed - No data to display  EKG   Radiology DG Chest 2 View  Result Date: 06/30/2023 CLINICAL DATA:  Cough EXAM: CHEST - 2 VIEW COMPARISON:  01/06/2022 FINDINGS: The heart size and mediastinal contours are within normal limits. Minimal diffuse interstitial opacity. Disc degenerative disease of the thoracic spine. IMPRESSION: Minimal diffuse interstitial opacity, consistent with edema or atypical/viral infection.  No focal airspace opacity. Electronically Signed   By: Jearld Lesch M.D.   On: 06/30/2023 13:32    Procedures Procedures (including critical care time)  Medications Ordered in UC  Medications - No data to display  Initial Impression / Assessment and Plan / UC Course  I have reviewed the triage vital signs and the nursing notes.  Pertinent labs & imaging results that were available during my care of the patient were reviewed by me and considered in my medical decision making (see chart for details).     Chest x-ray is showing atypical versus viral infection.  Most likely has bronchitis.  Given duration of symptoms, viral testing was deferred.  Will treat with azithromycin.  Also think patient would benefit from steroid therapy given harsh coughing noted on exam.  Patient reports she has an allergy to prednisone but has taken methylprednisone Dosepak previously before and tolerated well.  Called pharmacy to confirm dosage.  Rash appears to be consistent with atopic dermatitis that is intermittent.  Given topical medications have not been helpful, steroid should be helpful with this as well.  Ambulatory referral to dermatology was placed given duration of time since rash started.  Advised patient that if they do not call her within next 48 to 72 hours, she is to call them herself at provided phone number.  Advised supportive care and symptom management.  Advised follow-up if any symptoms persist or worsen. Patient verbalized understanding and was agreeable with plan.  Final Clinical Impressions(s) / UC Diagnoses   Final diagnoses:  Persistent cough  Rash and nonspecific skin eruption     Discharge Instructions      I have prescribed you an antibiotic and a steroid.  Follow-up if any symptoms persist or worsen.  Referral to dermatology has also been placed.  If they do not call you in the next 48 to 72 hours, please call them yourself at provided contact information.     ED Prescriptions      Medication Sig Dispense Auth. Provider   azithromycin (ZITHROMAX Z-PAK) 250 MG tablet Take 2 tablets (500 mg total) by mouth daily for 1 day, THEN 1 tablet (250 mg total) daily for 4 days. 6 tablet Kimberly Bush, Kimberly Bush, [place]   methylPREDNISolone (MEDROL DOSEPAK) 4 MG TBPK tablet Take per package instructions 1 each Gustavus Bryant, FNP      PDMP not reviewed this encounter.   Gustavus Bryant, [place] 06/30/23 1436

## 2023-06-30 NOTE — ED Triage Notes (Signed)
Pt reports transient burning, pruritic large red lesion to bilat anterior elbow area onset 10 months ago - was given cortisone cream by her PCP, but she has ran out.  Also c/o dry cough onset approx 2 wks ago. Describes coughing fits and moments of urinary incontinence due to coughing. Has been taking Zyrtec (states takes year-round for allergy sxs) and Benadryl, but cough continues. Denies fevers.

## 2023-07-01 ENCOUNTER — Other Ambulatory Visit: Payer: Self-pay | Admitting: Nurse Practitioner

## 2023-07-01 DIAGNOSIS — Z1231 Encounter for screening mammogram for malignant neoplasm of breast: Secondary | ICD-10-CM

## 2023-07-05 ENCOUNTER — Ambulatory Visit
Admission: EM | Admit: 2023-07-05 | Discharge: 2023-07-05 | Disposition: A | Discharge status: Home / Self Care | Payer: 59 | Attending: Internal Medicine | Admitting: Internal Medicine

## 2023-07-05 DIAGNOSIS — R053 Chronic cough: Secondary | ICD-10-CM

## 2023-07-05 MED ORDER — BENZONATATE 100 MG PO CAPS: 100.0000 mg | ORAL_CAPSULE | Freq: Three times a day (TID) | ORAL | 0 refills | Status: DC | PRN

## 2023-07-05 NOTE — Discharge Instructions (Signed)
I have prescribed you a cough medication to take as needed.  Please follow-up if any symptoms persist or worsen.

## 2023-07-05 NOTE — ED Provider Notes (Signed)
[facility]    CSN: [id number] Arrival date & time: 07/05/23  1534      History   Chief Complaint No chief complaint on file.   HPI Kimberly Bush is a 62 y.o. female.   Patient presents with persistent dry cough that has been present for about 2 to 3 weeks.  Patient was seen on 06/30/2023 and was prescribed azithromycin and Medrol Dosepak which she has completed.  Reports minimal improvement but cough is still persistent.  Denies any recent fevers, chest pain, shortness of breath.     Past Medical History:  Diagnosis Date   Hypertension    Seasonal allergies     Patient Active Problem List   Diagnosis Date Noted   Hyperlipidemia LDL goal <100 04/05/2022   Gastroesophageal reflux disease without esophagitis 02/21/2022   Essential hypertension 01/16/2022   Body mass index 29.0-29.9, adult 01/16/2022   Family history of early CAD 01/16/2022    Past Surgical History:  Procedure Laterality Date   BACK SURGERY      OB History   No obstetric history on file.      Home Medications    Prior to Admission medications   Medication Sig Start Date End Date Taking? Authorizing Provider  benzonatate (TESSALON) 100 MG capsule Take 1 capsule (100 mg total) by mouth every 8 (eight) hours as needed for cough. 07/05/23  Yes Cesario Weidinger, Rolly Salter E, FNP  azithromycin (ZITHROMAX Z-PAK) 250 MG tablet Take 2 tablets (500 mg total) by mouth daily for 1 day, THEN 1 tablet (250 mg total) daily for 4 days. 06/30/23 07/05/23  Gustavus Bryant, FNP  Cetirizine HCl (ZYRTEC PO) Take by mouth.    [provider]  diphenhydrAMINE HCl (BENADRYL PO) Take by mouth.    [provider]  hydrochlorothiazide (HYDRODIURIL) 25 MG tablet TAKE 1/2 (ONE-HALF) TO 1 TABLET BY MOUTH ONCE DAILY 05/03/23   Carlean Jews, NP  lisinopril (ZESTRIL) 40 MG tablet Take 1 tablet (40 mg total) by mouth daily. 05/03/23   Carlean Jews, NP  methylPREDNISolone (MEDROL DOSEPAK) 4 MG TBPK tablet Take per  package instructions 06/30/23   Gustavus Bryant, FNP  omeprazole (PRILOSEC) 40 MG capsule Take 1 capsule (40 mg total) by mouth daily. 05/03/23   Carlean Jews, NP  triamcinolone cream (KENALOG) 0.1 % Apply 1 Application topically 2 (two) times daily. 09/14/22   Gustavus Bryant, FNP    Family History Family History  Problem Relation Age of Onset   Hypertension Mother     Social History Social History   Tobacco Use   Smoking status: Never  Vaping Use   Vaping status: Never Used  Substance Use Topics   Alcohol use: Yes    Comment: very occasionally   Drug use: No     Allergies   Prednisone   Review of Systems Review of Systems Per HPI  Physical Exam Triage Vital Signs ED Triage Vitals  Encounter Vitals Group     BP 07/05/23 1644 136/74     Systolic BP Percentile --      Diastolic BP Percentile --      Pulse Rate 07/05/23 1644 89     Resp 07/05/23 1644 13     Temp 07/05/23 1644 98.5 F (36.9 C)     Temp Source 07/05/23 1644 Oral     SpO2 07/05/23 1644 94 %     Weight --      Height --      Head  Circumference --      Peak Flow --      Pain Score 07/05/23 1648 0     Pain Loc --      Pain Education --      Exclude from Growth Chart --    No data found.  Updated Vital Signs BP 136/74 (BP Location: Left Arm)   Pulse 89   Temp 98.5 F (36.9 C) (Oral)   Resp 13   LMP  (LMP Unknown)   SpO2 94%   Visual Acuity Right Eye Distance:   Left Eye Distance:   Bilateral Distance:    Right Eye Near:   Left Eye Near:    Bilateral Near:     Physical Exam Constitutional:      General: She is not in acute distress.    Appearance: Normal appearance. She is not toxic-appearing or diaphoretic.  HENT:     Head: Normocephalic and atraumatic.  Eyes:     Extraocular Movements: Extraocular movements intact.     Conjunctiva/sclera: Conjunctivae normal.  Cardiovascular:     Rate and Rhythm: Normal rate and regular rhythm.     Pulses: Normal pulses.     Heart sounds:  Normal heart sounds.  Pulmonary:     Effort: Pulmonary effort is normal. No respiratory distress.     Breath sounds: Normal breath sounds. No stridor. No wheezing, rhonchi or rales.  Neurological:     General: No focal deficit present.     Mental Status: She is alert and oriented to person, place, and time. Mental status is at baseline.  Psychiatric:        Mood and Affect: Mood normal.        Behavior: Behavior normal.        Thought Content: Thought content normal.        Judgment: Judgment normal.      UC Treatments / Results  Labs (all labs ordered are listed, but only abnormal results are displayed) Labs Reviewed - No data to display  EKG   Radiology No results found.  Procedures Procedures (including critical care time)  Medications Ordered in UC Medications - No data to display  Initial Impression / Assessment and Plan / UC Course  I have reviewed the triage vital signs and the nursing notes.  Pertinent labs & imaging results that were available during my care of the patient were reviewed by me and considered in my medical decision making (see chart for details).     Suspect most likely postviral cough that has been persistent.  Patient had negative chest x-ray at previous urgent care visit and reports minimal improvement in cough.  There are no adventitious lung sounds on exam so do not think chest imaging is necessary.  No indication for antibiotic therapy on exam at this time.  Will treat with benzonatate symptomatically.  Advised her to follow-up with PCP or urgent care if symptoms persist or worsen.  Patient verbalized understanding and was agreeable with plan. Final Clinical Impressions(s) / UC Diagnoses   Final diagnoses:  Persistent cough     Discharge Instructions      I have prescribed you a cough medication to take as needed.  Please follow-up if any symptoms persist or worsen.    ED Prescriptions     Medication Sig Dispense Auth. Provider    benzonatate (TESSALON) 100 MG capsule Take 1 capsule (100 mg total) by mouth every 8 (eight) hours as needed for cough. 21 capsule [place], Acie Fredrickson, [place]  PDMP not reviewed this encounter.   Gustavus Bryant, [place] 07/05/23 [phone]

## 2023-07-05 NOTE — ED Triage Notes (Signed)
Pt c/o cough, pt states she was treated here on 06/30/2023 for a persistent cough, thast has seem to have gotten a little better with the antibiotics but has not completely gone away.

## 2023-07-07 ENCOUNTER — Ambulatory Visit: Payer: 59 | Admitting: Internal Medicine

## 2023-07-12 ENCOUNTER — Ambulatory Visit
Admission: EM | Admit: 2023-07-12 | Discharge: 2023-07-12 | Disposition: A | Discharge status: Home / Self Care | Payer: 59 | Attending: Physician Assistant | Admitting: Physician Assistant

## 2023-07-12 DIAGNOSIS — J209 Acute bronchitis, unspecified: Secondary | ICD-10-CM | POA: Diagnosis not present

## 2023-07-12 MED ORDER — AMOXICILLIN-POT CLAVULANATE 875-125 MG PO TABS: 1.0000 | ORAL_TABLET | Freq: Two times a day (BID) | ORAL | 0 refills | Status: DC

## 2023-07-12 MED ORDER — ALBUTEROL SULFATE HFA 108 (90 BASE) MCG/ACT IN AERS: 1.0000 | INHALATION_SPRAY | Freq: Four times a day (QID) | RESPIRATORY_TRACT | 0 refills | Status: DC | PRN

## 2023-07-12 NOTE — ED Provider Notes (Signed)
[facility]    CSN: [id number] Arrival date & time: 07/12/23  1135      History   Chief Complaint Chief Complaint  Patient presents with   Cough    Follow up    HPI Kimberly Bush is a 62 y.o. female.   Patient here today for evaluation of cough that started 2 weeks ago.  She states that she has been treated with both antibiotics and steroids but cough continues.  She has not had any fever.  She reports cough is occasionally productive.  She notes that she will still have coughing fits.  She denies any shortness of breath.  She has not had any wheezing.  The history is provided by the patient.  Cough Associated symptoms: no chills, no eye discharge, no fever, no shortness of breath and no wheezing     Past Medical History:  Diagnosis Date   Hypertension    Seasonal allergies     Patient Active Problem List   Diagnosis Date Noted   Hyperlipidemia LDL goal <100 04/05/2022   Gastroesophageal reflux disease without esophagitis 02/21/2022   Essential hypertension 01/16/2022   Body mass index 29.0-29.9, adult 01/16/2022   Family history of early CAD 01/16/2022    Past Surgical History:  Procedure Laterality Date   BACK SURGERY      OB History   No obstetric history on file.      Home Medications    Prior to Admission medications   Medication Sig Start Date End Date Taking? Authorizing Provider  albuterol (VENTOLIN HFA) 108 (90 Base) MCG/ACT inhaler Inhale 1-2 puffs into the lungs every 6 (six) hours as needed for wheezing or shortness of breath. 07/12/23  Yes Tomi Bamberger, PA-C  amoxicillin-clavulanate (AUGMENTIN) 875-125 MG tablet Take 1 tablet by mouth every 12 (twelve) hours. 07/12/23  Yes Tomi Bamberger, PA-C  benzonatate (TESSALON) 100 MG capsule Take 1 capsule (100 mg total) by mouth every 8 (eight) hours as needed for cough. 07/05/23  Yes [place], Rolly Salter E, FNP  Cetirizine HCl (ZYRTEC PO) Take by mouth.   Yes [provider]  lisinopril  (ZESTRIL) 40 MG tablet Take 1 tablet (40 mg total) by mouth daily. 05/03/23  Yes Boscia, Heather E, NP  diphenhydrAMINE HCl (BENADRYL PO) Take by mouth.    [provider]  hydrochlorothiazide (HYDRODIURIL) 25 MG tablet TAKE 1/2 (ONE-HALF) TO 1 TABLET BY MOUTH ONCE DAILY 05/03/23   Carlean Jews, NP  methylPREDNISolone (MEDROL DOSEPAK) 4 MG TBPK tablet Take per package instructions 06/30/23   Gustavus Bryant, FNP  omeprazole (PRILOSEC) 40 MG capsule Take 1 capsule (40 mg total) by mouth daily. 05/03/23   Carlean Jews, NP  triamcinolone cream (KENALOG) 0.1 % Apply 1 Application topically 2 (two) times daily. 09/14/22   Gustavus Bryant, FNP    Family History Family History  Problem Relation Age of Onset   Hypertension Mother     Social History Social History   Tobacco Use   Smoking status: Never   Smokeless tobacco: Never  Vaping Use   Vaping status: Never Used  Substance Use Topics   Alcohol use: Yes    Comment: very occasionally   Drug use: No     Allergies   Prednisone   Review of Systems Review of Systems  Constitutional:  Negative for chills and fever.  Eyes:  Negative for discharge and redness.  Respiratory:  Positive for cough. Negative for shortness of breath and wheezing.  Gastrointestinal:  Negative for abdominal pain, diarrhea, nausea and vomiting.     Physical Exam Triage Vital Signs ED Triage Vitals  Encounter Vitals Group     BP 07/12/23 1236 122/83     Systolic BP Percentile --      Diastolic BP Percentile --      Pulse Rate 07/12/23 1236 63     Resp 07/12/23 1236 18     Temp 07/12/23 1236 98.2 F (36.8 C)     Temp Source 07/12/23 1236 Oral     SpO2 07/12/23 1236 97 %     Weight 07/12/23 1233 163 lb 2.3 oz (74 kg)     Height 07/12/23 1233 5\' 4"  (1.626 m)     Head Circumference --      Peak Flow --      Pain Score 07/12/23 1233 0     Pain Loc --      Pain Education --      Exclude from Growth Chart --    No data found.  Updated  Vital Signs BP 122/83 (BP Location: Left Arm)   Pulse 63   Temp 98.2 F (36.8 C) (Oral)   Resp 18   Ht 5\' 4"  (1.626 m)   Wt 163 lb 2.3 oz (74 kg)   LMP  (LMP Unknown)   SpO2 97%   BMI 28.00 kg/m      Physical Exam Vitals and nursing note reviewed.  Constitutional:      General: She is not in acute distress.    Appearance: Normal appearance. She is not ill-appearing.  HENT:     Head: Normocephalic and atraumatic.  Eyes:     Conjunctiva/sclera: Conjunctivae normal.  Cardiovascular:     Rate and Rhythm: Normal rate and regular rhythm.  Pulmonary:     Effort: Pulmonary effort is normal. No respiratory distress.     Breath sounds: Normal breath sounds. No wheezing, rhonchi or rales.  Neurological:     Mental Status: She is alert.  Psychiatric:        Mood and Affect: Mood normal.        Behavior: Behavior normal.        Thought Content: Thought content normal.      UC Treatments / Results  Labs (all labs ordered are listed, but only abnormal results are displayed) Labs Reviewed - No data to display  EKG   Radiology No results found.  Procedures Procedures (including critical care time)  Medications Ordered in UC Medications - No data to display  Initial Impression / Assessment and Plan / UC Course  I have reviewed the triage vital signs and the nursing notes.  Pertinent labs & imaging results that were available during my care of the patient were reviewed by me and considered in my medical decision making (see chart for details).    Given continued symptoms will trial albuterol inhaler as well as additional antibiotics.  Discussed that cough from bronchitis can sometimes last 6 to 8 weeks.  Encouraged follow-up if no gradual improvement or with any worsening.  Final Clinical Impressions(s) / UC Diagnoses   Final diagnoses:  Acute bronchitis, unspecified organism   Discharge Instructions   None    ED Prescriptions     Medication Sig Dispense Auth.  Provider   amoxicillin-clavulanate (AUGMENTIN) 875-125 MG tablet Take 1 tablet by mouth every 12 (twelve) hours. 14 tablet Erma Pinto F, PA-C   albuterol (VENTOLIN HFA) 108 (90 Base) MCG/ACT inhaler Inhale 1-2 puffs into  the lungs every 6 (six) hours as needed for wheezing or shortness of breath. 8 g Tomi Bamberger, PA-C      PDMP not reviewed this encounter.   Tomi Bamberger, PA-C 07/12/23 1550

## 2023-07-12 NOTE — ED Triage Notes (Signed)
Cough "started 2 wks ago, this is my 3rd time here, antibiotic and steroid given originally, next appointment treated cough". All medications done "and cough remains". No fever.

## 2023-07-14 ENCOUNTER — Ambulatory Visit
Admission: EM | Admit: 2023-07-14 | Discharge: 2023-07-14 | Disposition: A | Discharge status: Home / Self Care | Payer: 59 | Attending: Internal Medicine | Admitting: Internal Medicine

## 2023-07-14 ENCOUNTER — Ambulatory Visit (INDEPENDENT_AMBULATORY_CARE_PROVIDER_SITE_OTHER): Payer: 59

## 2023-07-14 DIAGNOSIS — L209 Atopic dermatitis, unspecified: Secondary | ICD-10-CM

## 2023-07-14 DIAGNOSIS — R053 Chronic cough: Secondary | ICD-10-CM

## 2023-07-14 MED ORDER — TRIAMCINOLONE ACETONIDE 0.1 % EX CREA: 1.0000 | TOPICAL_CREAM | Freq: Two times a day (BID) | CUTANEOUS | 0 refills | Status: AC

## 2023-07-14 NOTE — ED Provider Notes (Signed)
[facility]    CSN: [id number] Arrival date & time: 07/14/23  1339      History   Chief Complaint Chief Complaint  Patient presents with   Cough    HPI Kimberly Bush is a 62 y.o. female.   Patient presents for follow-up of persistent cough that has been present for about 7 weeks.  This is the fourth urgent care visit for this cough.  Patient was seen on 7/17 and prescribed azithromycin and Medrol Dosepak.  She was then seen again on 7/22 and prescribed benzonatate for cough.  She had negative chest x-ray at previous visit on 7/17.  She was seen again on 7/29 and prescribed Augmentin and albuterol inhaler.  Reports shortness of breath with coughing fits but otherwise denies chest pain or shortness of breath.  Denies history of asthma or COPD and patient does not smoke cigarettes.  Denies any recent fevers.  Patient also reporting flareup of rash to bilateral antecubital spaces of the upper extremities.  She was seen previously on 7/17 for this and in 2023. Saw improvement with triamcinolone cream and Medrol Dosepak.  States that she has been applying a lotion topically with no improvement.  She has an appointment with dermatology in September for this.   Cough   Past Medical History:  Diagnosis Date   Hypertension    Seasonal allergies     Patient Active Problem List   Diagnosis Date Noted   Hyperlipidemia LDL goal <100 04/05/2022   Gastroesophageal reflux disease without esophagitis 02/21/2022   Essential hypertension 01/16/2022   Body mass index 29.0-29.9, adult 01/16/2022   Family history of early CAD 01/16/2022    Past Surgical History:  Procedure Laterality Date   BACK SURGERY      OB History   No obstetric history on file.      Home Medications    Prior to Admission medications   Medication Sig Start Date End Date Taking? Authorizing Provider  triamcinolone cream (KENALOG) 0.1 % Apply 1 Application topically 2 (two) times daily. 07/14/23  Yes  Destenee Guerry, Acie Fredrickson, FNP  albuterol (VENTOLIN HFA) 108 (90 Base) MCG/ACT inhaler Inhale 1-2 puffs into the lungs every 6 (six) hours as needed for wheezing or shortness of breath. 07/12/23   Tomi Bamberger, PA-C  amoxicillin-clavulanate (AUGMENTIN) 875-125 MG tablet Take 1 tablet by mouth every 12 (twelve) hours. 07/12/23   Tomi Bamberger, PA-C  benzonatate (TESSALON) 100 MG capsule Take 1 capsule (100 mg total) by mouth every 8 (eight) hours as needed for cough. 07/05/23   Gustavus Bryant, FNP  Cetirizine HCl (ZYRTEC PO) Take by mouth.    [provider]  diphenhydrAMINE HCl (BENADRYL PO) Take by mouth.    [provider]  hydrochlorothiazide (HYDRODIURIL) 25 MG tablet TAKE 1/2 (ONE-HALF) TO 1 TABLET BY MOUTH ONCE DAILY 05/03/23   Carlean Jews, NP  lisinopril (ZESTRIL) 40 MG tablet Take 1 tablet (40 mg total) by mouth daily. 05/03/23   Carlean Jews, NP  methylPREDNISolone (MEDROL DOSEPAK) 4 MG TBPK tablet Take per package instructions 06/30/23   Gustavus Bryant, FNP  omeprazole (PRILOSEC) 40 MG capsule Take 1 capsule (40 mg total) by mouth daily. 05/03/23   Carlean Jews, NP    Family History Family History  Problem Relation Age of Onset   Hypertension Mother     Social History Social History   Tobacco Use   Smoking status: Never   Smokeless tobacco: Never  Vaping Use  Vaping status: Never Used  Substance Use Topics   Alcohol use: Yes    Comment: very occasionally   Drug use: No     Allergies   Prednisone   Review of Systems Review of Systems Per HPI  Physical Exam Triage Vital Signs ED Triage Vitals  Encounter Vitals Group     BP 07/14/23 1350 117/77     Systolic BP Percentile --      Diastolic BP Percentile --      Pulse Rate 07/14/23 1349 (!) 114     Resp 07/14/23 1349 16     Temp 07/14/23 1349 98.2 F (36.8 C)     Temp Source 07/14/23 1349 Oral     SpO2 07/14/23 1349 97 %     Weight --      Height --      Head Circumference --       Peak Flow --      Pain Score 07/14/23 1350 0     Pain Loc --      Pain Education --      Exclude from Growth Chart --    No data found.  Updated Vital Signs BP 117/77 (BP Location: Left Arm)   Pulse (!) 114   Temp 98.2 F (36.8 C) (Oral)   Resp 16   LMP  (LMP Unknown)   SpO2 97%   Visual Acuity Right Eye Distance:   Left Eye Distance:   Bilateral Distance:    Right Eye Near:   Left Eye Near:    Bilateral Near:     Physical Exam Constitutional:      General: She is not in acute distress.    Appearance: Normal appearance. She is not toxic-appearing or diaphoretic.  HENT:     Head: Normocephalic and atraumatic.  Eyes:     Extraocular Movements: Extraocular movements intact.     Conjunctiva/sclera: Conjunctivae normal.  Cardiovascular:     Rate and Rhythm: Normal rate and regular rhythm.     Pulses: Normal pulses.     Heart sounds: Normal heart sounds.  Pulmonary:     Effort: Pulmonary effort is normal. No respiratory distress.     Breath sounds: Normal breath sounds. No stridor. No wheezing, rhonchi or rales.  Skin:    Comments: Has flat, erythematous rash present to bilateral antecubital spaces.  Neurological:     General: No focal deficit present.     Mental Status: She is alert and oriented to person, place, and time. Mental status is at baseline.  Psychiatric:        Mood and Affect: Mood normal.        Behavior: Behavior normal.        Thought Content: Thought content normal.        Judgment: Judgment normal.      UC Treatments / Results  Labs (all labs ordered are listed, but only abnormal results are displayed) Labs Reviewed - No data to display  EKG   Radiology DG Chest 2 View  Result Date: 07/14/2023 CLINICAL DATA:  cough EXAM: CHEST - 2 VIEW COMPARISON:  06/30/2023 FINDINGS: Lungs are clear. Heart size and mediastinal contours are within normal limits. No effusion. Visualized bones unremarkable. IMPRESSION: No acute cardiopulmonary disease.  Electronically Signed   By: Corlis Leak M.D.   On: 07/14/2023 15:12    Procedures Procedures (including critical care time)  Medications Ordered in UC Medications - No data to display  Initial Impression / Assessment and Plan /  UC Course  I have reviewed the triage vital signs and the nursing notes.  Pertinent labs & imaging results that were available during my care of the patient were reviewed by me and considered in my medical decision making (see chart for details).     1.  Persistent cough  Chest x-ray completed per patient request today.  It was negative for any acute cardiopulmonary process.  Suspect persistent postviral cough versus acute bronchitis.  Advised patient to continue Augmentin and albuterol inhaler.  Will place pulmonology referral given cough has been persistent.  Advised follow-up with PCP as well.  She reports that she has an appointment in September.  Patient's heart rate was 110s on triage but it was normal on physical exam so no further workup is necessary for this.  2.  Rash  This rash appears consistent with atopic dermatitis.  Patient saw improvement with triamcinolone previously but has run out of this.  Will refill this medication and have her follow-up with dermatology.  She states that she already has an appointment scheduled with dermatology in September.  Also advised Vaseline based moisturizers and avoiding scented lotions and soaps.  Patient verbalized understanding and was agreeable with plan. Final Clinical Impressions(s) / UC Diagnoses   Final diagnoses:  Persistent cough for 3 weeks or longer  Atopic dermatitis, unspecified type   Discharge Instructions   None    ED Prescriptions     Medication Sig Dispense Auth. Provider   triamcinolone cream (KENALOG) 0.1 % Apply 1 Application topically 2 (two) times daily. 30 g Gustavus Bryant, [place]      PDMP not reviewed this encounter.   Gustavus Bryant, [place] 07/14/23 [phone]

## 2023-07-14 NOTE — ED Triage Notes (Signed)
Pt states cough for the past 7 weeks seen here for same states she would like a chest xray.  Pt also has a rash to bilateral arms.

## 2023-07-27 ENCOUNTER — Ambulatory Visit: Payer: 59 | Admitting: Internal Medicine

## 2023-07-28 ENCOUNTER — Ambulatory Visit (INDEPENDENT_AMBULATORY_CARE_PROVIDER_SITE_OTHER): Payer: 59

## 2023-07-28 ENCOUNTER — Other Ambulatory Visit: Payer: Self-pay | Admitting: Nurse Practitioner

## 2023-07-28 DIAGNOSIS — Z1231 Encounter for screening mammogram for malignant neoplasm of breast: Secondary | ICD-10-CM

## 2023-07-28 DIAGNOSIS — K219 Gastro-esophageal reflux disease without esophagitis: Secondary | ICD-10-CM

## 2023-07-28 DIAGNOSIS — I10 Essential (primary) hypertension: Secondary | ICD-10-CM

## 2023-07-29 NOTE — Progress Notes (Signed)
Negative mammogram  Repeat in 1 year

## 2023-08-10 ENCOUNTER — Other Ambulatory Visit: Payer: Self-pay | Admitting: Family Medicine

## 2023-08-10 DIAGNOSIS — E785 Hyperlipidemia, unspecified: Secondary | ICD-10-CM

## 2023-08-10 DIAGNOSIS — I10 Essential (primary) hypertension: Secondary | ICD-10-CM

## 2023-08-12 ENCOUNTER — Other Ambulatory Visit: Payer: 59

## 2023-08-12 DIAGNOSIS — E785 Hyperlipidemia, unspecified: Secondary | ICD-10-CM

## 2023-08-12 DIAGNOSIS — I10 Essential (primary) hypertension: Secondary | ICD-10-CM

## 2023-08-13 LAB — LIPID PANEL
Chol/HDL Ratio: 5 ratio — ABNORMAL HIGH (ref 0.0–4.4)
Cholesterol, Total: 201 mg/dL — ABNORMAL HIGH (ref 100–199)
HDL: 40 mg/dL (ref >39)
LDL Chol Calc (NIH): 132 mg/dL — ABNORMAL HIGH (ref 0–99)
Triglycerides: 162 mg/dL — ABNORMAL HIGH (ref 0–149)
VLDL Cholesterol Cal: 29 mg/dL (ref 5–40)

## 2023-08-13 LAB — COMPREHENSIVE METABOLIC PANEL
ALT: 29 IU/L (ref 0–32)
AST: 30 IU/L (ref 0–40)
Albumin: 4.4 g/dL (ref 3.8–4.9)
Alkaline Phosphatase: 119 IU/L (ref 44–121)
BUN/Creatinine Ratio: 14 (ref 12–28)
BUN: 16 mg/dL (ref 8–27)
Bilirubin Total: 1.1 mg/dL (ref 0.0–1.2)
CO2: 24 mmol/L (ref 20–29)
Calcium: 9.6 mg/dL (ref 8.7–10.3)
Chloride: 100 mmol/L (ref 96–106)
Creatinine, Ser: 1.16 mg/dL — ABNORMAL HIGH (ref 0.57–1.00)
Globulin, Total: 2.1 g/dL (ref 1.5–4.5)
Glucose: 75 mg/dL (ref 70–99)
Potassium: 4 mmol/L (ref 3.5–5.2)
Sodium: 139 mmol/L (ref 134–144)
Total Protein: 6.5 g/dL (ref 6.0–8.5)
eGFR: 54 mL/min/{1.73_m2} — ABNORMAL LOW (ref >59)

## 2023-08-18 ENCOUNTER — Ambulatory Visit: Payer: 59 | Admitting: Nurse Practitioner

## 2023-08-18 ENCOUNTER — Ambulatory Visit: Payer: 59 | Admitting: Family Medicine

## 2023-08-23 ENCOUNTER — Encounter: Payer: Self-pay | Admitting: Family Medicine

## 2023-08-23 ENCOUNTER — Ambulatory Visit (INDEPENDENT_AMBULATORY_CARE_PROVIDER_SITE_OTHER): Payer: 59 | Admitting: Family Medicine

## 2023-08-23 VITALS — BP 186/127 | HR 69 | Resp 18 | Ht 64.0 in | Wt 159.0 lb

## 2023-08-23 DIAGNOSIS — I10 Essential (primary) hypertension: Secondary | ICD-10-CM

## 2023-08-23 DIAGNOSIS — Z1159 Encounter for screening for other viral diseases: Secondary | ICD-10-CM

## 2023-08-23 DIAGNOSIS — R944 Abnormal results of kidney function studies: Secondary | ICD-10-CM

## 2023-08-23 NOTE — Progress Notes (Signed)
   Established Patient Office Visit  Subjective   Patient ID: Kimberly Bush, female    DOB: 10-Sep-1961  Age: 62 y.o. MRN: [id number]  Chief Complaint  Patient presents with   Hypertension    HPI Kimberly Bush is a 62 y.o. female presenting today for follow up of hypertension. Hypertension: Patient here for follow-up of elevated blood pressure. She is exercising and is adherent to low salt diet.   Pt denies chest pain, SOB, dizziness, edema, syncope, fatigue or heart palpitations. Taking lisinopril daily and hydrochlorothiazide one half to whole tablet as needed when she has edema, reports excellent compliance with treatment. Denies side effects.  ROS Negative unless otherwise noted in HPI   Objective:     BP (!) 186/127   Pulse 69   Resp 18   Ht 5\' 4"  (1.626 m)   Wt 159 lb (72.1 kg)   LMP  (LMP Unknown)   SpO2 97%   BMI 27.29 kg/m   Physical Exam Constitutional:      General: She is not in acute distress.    Appearance: Normal appearance.  HENT:     Head: Normocephalic and atraumatic.  Cardiovascular:     Rate and Rhythm: Normal rate and regular rhythm.     Heart sounds: No murmur heard.    No friction rub. No gallop.  Pulmonary:     Effort: Pulmonary effort is normal. No respiratory distress.     Breath sounds: No wheezing, rhonchi or rales.  Musculoskeletal:     Right lower leg: No edema.     Left lower leg: No edema.  Skin:    General: Skin is warm and dry.  Neurological:     Mental Status: She is alert and oriented to person, place, and time.     Assessment & Plan:  Essential hypertension Assessment & Plan: BP goal <130/80.  She has consistently been at goal.  Continue lisinopril 40 mg daily, hydrochlorothiazide 12.5 to 25 mg as needed for peripheral edema.  Keep blood pressure log and bring to nurse visit blood pressure check in 2 weeks.  If blood pressure is consistently above goal, increase hydrochlorothiazide to 12.5 mg daily.  Recheck kidney function in about  3 months, elevated creatinine and decreased GFR may be secondary to medications prescribed for upper respiratory infection.   Orders: -     Comprehensive metabolic panel; Future  Decreased GFR -     Comprehensive metabolic panel; Future  Screening for viral disease -     Hepatitis C antibody; Future    Return in about 6 months (around 02/20/2024) for annual physical, fasting blood work 1 week before.  Recheck CMP in 3 months for kidney function.  Reviewed CBC, CMP, lipid panel. Within normal limits/stable other than creatinine increased to 1.16, EGFR decreased to 54.  Repeat in a few weeks.  LDL elevated at 132, triglycerides elevated 162.Melida Quitter, PA

## 2023-08-23 NOTE — Patient Instructions (Addendum)
Check your blood pressure twice each day after taking your lisinopril. Bring your log to the nurse visit in 2 weeks!

## 2023-08-23 NOTE — Assessment & Plan Note (Addendum)
BP goal <130/80.  She has consistently been at goal.  Continue lisinopril 40 mg daily, hydrochlorothiazide 12.5 to 25 mg as needed for peripheral edema.  Keep blood pressure log and bring to nurse visit blood pressure check in 2 weeks.  If blood pressure is consistently above goal, increase hydrochlorothiazide to 12.5 mg daily.  Recheck kidney function in about 3 months, elevated creatinine and decreased GFR may be secondary to medications prescribed for upper respiratory infection.

## 2023-08-25 ENCOUNTER — Ambulatory Visit: Payer: 59 | Admitting: Allergy

## 2023-09-06 ENCOUNTER — Ambulatory Visit (INDEPENDENT_AMBULATORY_CARE_PROVIDER_SITE_OTHER): Payer: 59 | Admitting: Family Medicine

## 2023-09-06 ENCOUNTER — Encounter: Payer: Self-pay | Admitting: Family Medicine

## 2023-09-06 VITALS — BP 145/85 | HR 71 | Resp 20 | Ht 64.0 in | Wt 159.0 lb

## 2023-09-06 DIAGNOSIS — I10 Essential (primary) hypertension: Secondary | ICD-10-CM

## 2023-09-06 NOTE — Progress Notes (Signed)
Pt denies CP, SOB, dizziness, or heart palpitations. taking meds as directed without problems. Denies med side effects. 5 min spent with pt.

## 2023-09-09 ENCOUNTER — Encounter: Payer: Self-pay | Admitting: Dermatology

## 2023-09-09 ENCOUNTER — Ambulatory Visit: Payer: 59 | Admitting: Dermatology

## 2023-09-09 VITALS — BP 128/85 | HR 70

## 2023-09-09 DIAGNOSIS — L308 Other specified dermatitis: Secondary | ICD-10-CM

## 2023-09-09 DIAGNOSIS — L209 Atopic dermatitis, unspecified: Secondary | ICD-10-CM | POA: Diagnosis not present

## 2023-09-09 MED ORDER — METHYLPREDNISOLONE 4 MG PO TBPK
ORAL_TABLET | ORAL | 0 refills | Status: DC
Start: 2023-09-09 — End: 2023-11-29

## 2023-09-09 MED ORDER — HYDROCORTISONE 2.5 % EX CREA
TOPICAL_CREAM | Freq: Two times a day (BID) | CUTANEOUS | 5 refills | Status: AC | PRN
Start: 2023-09-09 — End: ?

## 2023-09-09 MED ORDER — CLOBETASOL PROPIONATE 0.05 % EX CREA
1.0000 | TOPICAL_CREAM | Freq: Two times a day (BID) | CUTANEOUS | 0 refills | Status: AC
Start: 2023-09-09 — End: ?

## 2023-09-09 NOTE — Patient Instructions (Addendum)
Hello Kimberly Bush,  Thank you for visiting my office today. Your dedication to addressing your dermatological concerns and improving your health is greatly appreciated. Here is a summary of the key instructions from today's consultation:  - Clobetasol Cream:   - Application: Apply twice daily to affected areas on the body only.   - Duration: Continue for two weeks, then pause to prevent skin thinning and stretch marks.  - Hydrocortisone 2.5% Ointment:   - Application: Apply to your face twice daily.   - Duration: Use for ten days, then pause for at least two weeks. Depending on your exposure to the kitten, you may need to alternate ten days on and ten days off.  - Medrol Dose Pack:   - Instructions: Start the dosage as prescribed today, if possible before noon, or start tomorrow morning. Follow the tapering schedule provided.  - Allergy Management:   - Instructions: Continue taking Zyrtec daily. Consider discussing long-term allergy management options, such as allergy shots, with your allergist in December.  Please ensure to monitor your skin's response to the new treatments and report any adverse effects. Our goal is to ensure your comfort and to efficiently clear up the current flare-up.  Warm regards,  Dr. [facility]   Important Information  Due to recent changes in healthcare laws, you may see results of your pathology and/or laboratory studies on MyChart before the doctors have had a chance to review them. We understand that in some cases there may be results that are confusing or concerning to you. Please understand that not all results are received at the same time and often the doctors may need to interpret multiple results in order to provide you with the best plan of care or course of treatment. Therefore, we ask that you please give [place] 2 business days to thoroughly review all your results before contacting the office for clarification. Should we see a critical lab  result, you will be contacted sooner.   If You Need Anything After Your Visit  If you have any questions or concerns for your doctor, please call our main line at [phone] If no one answers, please leave a voicemail as directed and we will return your call as soon as possible. Messages left after 4 pm will be answered the following business day.   You may also send [place] a message via MyChart. We typically respond to MyChart messages within 1-2 business days.  For prescription refills, please ask your pharmacy to contact our office. Our fax number is [phone].  If you have an urgent issue when the clinic is closed that cannot wait until the next business day, you can page your doctor at the number below.    Please note that while we do our best to be available for urgent issues outside of office hours, we are not available 24/7.   If you have an urgent issue and are unable to reach [place], you may choose to seek medical care at your doctor's office, retail clinic, urgent care center, or emergency room.  If you have a medical emergency, please immediately call 911 or go to the emergency department. In the event of inclement weather, please call our main line at [phone] for an update on the status of any delays or closures.  Dermatology Medication Tips: Please keep the boxes that topical medications come in in order to help keep track of the instructions about where and how to use these. Pharmacies typically print the medication instructions only  on the boxes and not directly on the medication tubes.   If your medication is too expensive, please contact our office at [phone] or send [place] a message through MyChart.   We are unable to tell what your co-pay for medications will be in advance as this is different depending on your insurance coverage. However, we may be able to find a substitute medication at lower cost or fill out paperwork to get insurance to cover a needed medication.    If a prior authorization is required to get your medication covered by your insurance company, please allow [place] 1-2 business days to complete this process.  Drug prices often vary depending on where the prescription is filled and some pharmacies may offer cheaper prices.  The website www.goodrx.com contains coupons for medications through different pharmacies. The prices here do not account for what the cost may be with help from insurance (it may be cheaper with your insurance), but the website can give you the price if you did not use any insurance.  - You can print the associated coupon and take it with your prescription to the pharmacy.  - You may also stop by our office during regular business hours and pick up a GoodRx coupon card.  - If you need your prescription sent electronically to a different pharmacy, notify our office through [facility] or by phone at [phone]

## 2023-09-09 NOTE — Progress Notes (Signed)
   New Patient Visit   Subjective  Kimberly Bush is a 62 y.o. female who presents for the following: Rash  Patient states she has rash located at the arms & face that she would like to have examined. Patient reports the areas have been there for 1 year. She reports the areas are bothersome.[place] estates the areas are very itchy. Patient rates irritation 10 out of 10. She states that the areas have not spread. Patient reports she has previously been treated for these areas by PCP with [facility] which she feels it helps some but doesn't completely resolve. Patient denies Hx of bx. Patient denies family history of skin cancer(s). Patient reports the triggers are unknown. She reports she just rescued a kitten and she has a know allergy to cats.   The following portions of the chart were reviewed this encounter and updated as appropriate: medications, allergies, medical history  Review of Systems:  No other skin or systemic complaints except as noted in HPI or Assessment and Plan.  Objective  Well appearing patient in no apparent distress; mood and affect are within normal limits.  A focused examination was performed of the following areas: B/L Inner Arms at the bend and face  Relevant exam findings are noted in the Assessment and Plan.    Assessment & Plan   ATOPIC DERMATITIS Exam: Erythematous Scaly pink papules coalescing to plaques upper and lower eyelids  Flared  Atopic dermatitis (eczema) is a chronic, relapsing, pruritic condition that can significantly affect quality of life. It is often associated with allergic rhinitis and/or asthma and can require treatment with topical medications, phototherapy, or in severe cases biologic injectable medication (Dupixent; Adbry) or Oral JAK inhibitors.  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 Encourage the patient to follow up with an allergist in December for potential allergy shots.  Return if symptoms worsen or fail to improve.   Documentation: I have reviewed the above documentation for accuracy and completeness, and I agree with the above.  Stasia Cavalier, am acting as scribe for Langston Reusing, DO.  Langston Reusing, DO

## 2023-09-14 ENCOUNTER — Institutional Professional Consult (permissible substitution): Payer: 59 | Admitting: Internal Medicine

## 2023-09-22 ENCOUNTER — Institutional Professional Consult (permissible substitution): Payer: 59 | Admitting: Pulmonary Disease

## 2023-10-11 ENCOUNTER — Encounter: Payer: Self-pay | Admitting: Emergency Medicine

## 2023-10-11 ENCOUNTER — Ambulatory Visit
Admission: EM | Admit: 2023-10-11 | Discharge: 2023-10-11 | Disposition: A | Discharge status: Home / Self Care | Payer: 59 | Attending: Internal Medicine | Admitting: Internal Medicine

## 2023-10-11 ENCOUNTER — Other Ambulatory Visit: Payer: Self-pay

## 2023-10-11 DIAGNOSIS — L089 Local infection of the skin and subcutaneous tissue, unspecified: Secondary | ICD-10-CM | POA: Diagnosis not present

## 2023-10-11 DIAGNOSIS — L720 Epidermal cyst: Secondary | ICD-10-CM | POA: Diagnosis not present

## 2023-10-11 MED ORDER — DOXYCYCLINE HYCLATE 100 MG PO CAPS: 100.0000 mg | ORAL_CAPSULE | Freq: Two times a day (BID) | ORAL | 0 refills | Status: DC

## 2023-10-11 NOTE — ED Provider Notes (Signed)
[facility]    CSN: [id number] Arrival date & time: 10/11/23  1512      History   Chief Complaint Chief Complaint  Patient presents with   Abscess    HPI Kimberly Bush is a 62 y.o. female.   62 year old female who presents to urgent care with complaints of a small swollen area with tenderness along the back.  She reports noticing this about 3 weeks ago and it has swelled some and become a little bit more tender.  It was the same color as her skin but did get somewhat red today.  She denies any history of having this in the area previously.  She is scheduled to see dermatology regarding this next Tuesday.  She denies fevers or chills.  The pain is not severe.   Abscess Associated symptoms: no fever and no vomiting     Past Medical History:  Diagnosis Date   Hypertension    Seasonal allergies     Patient Active Problem List   Diagnosis Date Noted   Hyperlipidemia LDL goal <100 04/05/2022   Gastroesophageal reflux disease without esophagitis 02/21/2022   Essential hypertension 01/16/2022   Body mass index 29.0-29.9, adult 01/16/2022   Family history of early CAD 01/16/2022    Past Surgical History:  Procedure Laterality Date   BACK SURGERY      OB History   No obstetric history on file.      Home Medications    Prior to Admission medications   Medication Sig Start Date End Date Taking? Authorizing Provider  doxycycline (VIBRAMYCIN) 100 MG capsule Take 1 capsule (100 mg total) by mouth 2 (two) times daily. 10/11/23  Yes Yoniel Arkwright A, PA-C  Cetirizine HCl (ZYRTEC PO) Take by mouth.    [provider]  clobetasol cream (TEMOVATE) 0.05 % Apply 1 Application topically 2 (two) times daily. 09/09/23   Terri Piedra, DO  diphenhydrAMINE HCl (BENADRYL PO) Take by mouth.    [provider]  hydrochlorothiazide (HYDRODIURIL) 25 MG tablet TAKE 1/2 TO 1 TABLET BY MOUTH ONCE DAILY 07/28/23   Saralyn Pilar A, PA  hydrocortisone 2.5 %  cream Apply topically 2 (two) times daily as needed (Rash). 09/09/23   Terri Piedra, DO  lisinopril (ZESTRIL) 40 MG tablet TAKE 1 TABLET BY MOUTH DAILY 07/28/23   Melida Quitter, PA  methylPREDNISolone (MEDROL DOSEPAK) 4 MG TBPK tablet Take as directed Patient not taking: Reported on 10/11/2023 09/09/23   Terri Piedra, DO  omeprazole (PRILOSEC) 40 MG capsule TAKE 1 CAPSULE BY MOUTH DAILY 07/28/23   Saralyn Pilar A, PA  triamcinolone cream (KENALOG) 0.1 % Apply 1 Application topically 2 (two) times daily. 07/14/23   Gustavus Bryant, FNP    Family History Family History  Problem Relation Age of Onset   Hypertension Mother     Social History Social History   Tobacco Use   Smoking status: Never    Passive exposure: Never   Smokeless tobacco: Never  Vaping Use   Vaping status: Never Used  Substance Use Topics   Alcohol use: Not Currently    Comment: very occasionally   Drug use: No     Allergies   Prednisone   Review of Systems Review of Systems  Constitutional:  Negative for chills and fever.  HENT:  Negative for ear pain and sore throat.   Eyes:  Negative for pain and visual disturbance.  Respiratory:  Negative for cough and shortness of breath.   Cardiovascular:  Negative for chest pain and palpitations.  Gastrointestinal:  Negative for abdominal pain and vomiting.  Genitourinary:  Negative for dysuria and hematuria.  Musculoskeletal:  Negative for arthralgias and back pain.  Skin:  Positive for color change. Negative for rash.  Neurological:  Negative for seizures and syncope.  All other systems reviewed and are negative.    Physical Exam Triage Vital Signs ED Triage Vitals  Encounter Vitals Group     BP 10/11/23 1551 106/69     Systolic BP Percentile --      Diastolic BP Percentile --      Pulse Rate 10/11/23 1551 60     Resp 10/11/23 1551 16     Temp 10/11/23 1551 98.2 F (36.8 C)     Temp Source 10/11/23 1551 Oral     SpO2 10/11/23 1551 96 %      Weight --      Height --      Head Circumference --      Peak Flow --      Pain Score 10/11/23 1549 5     Pain Loc --      Pain Education --      Exclude from Growth Chart --    No data found.  Updated Vital Signs BP 106/69 (BP Location: Right Arm)   Pulse 60   Temp 98.2 F (36.8 C) (Oral)   Resp 16   LMP  (LMP Unknown)   SpO2 96%   Visual Acuity Right Eye Distance:   Left Eye Distance:   Bilateral Distance:    Right Eye Near:   Left Eye Near:    Bilateral Near:     Physical Exam Vitals and nursing note reviewed.  Constitutional:      General: She is not in acute distress.    Appearance: She is well-developed.  HENT:     Head: Normocephalic and atraumatic.  Eyes:     Conjunctiva/sclera: Conjunctivae normal.  Cardiovascular:     Rate and Rhythm: Normal rate and regular rhythm.     Heart sounds: No murmur heard. Pulmonary:     Effort: Pulmonary effort is normal. No respiratory distress.     Breath sounds: Normal breath sounds.  Abdominal:     Palpations: Abdomen is soft.     Tenderness: There is no abdominal tenderness.  Musculoskeletal:        General: No swelling.     Cervical back: Neck supple.  Skin:    General: Skin is warm and dry.     Capillary Refill: Capillary refill takes less than 2 seconds.       Neurological:     Mental Status: She is alert.  Psychiatric:        Mood and Affect: Mood normal.      UC Treatments / Results  Labs (all labs ordered are listed, but only abnormal results are displayed) Labs Reviewed - No data to display  EKG   Radiology No results found.  Procedures Procedures (including critical care time)  Medications Ordered in UC Medications - No data to display  Initial Impression / Assessment and Plan / UC Course  I have reviewed the triage vital signs and the nursing notes.  Pertinent labs & imaging results that were available during my care of the patient were reviewed by me and considered in my medical  decision making (see chart for details).     Infected epithelial inclusion cyst   This appears to be an infected epidermal inclusion  cyst or skin cyst.  The infection is mild at this point and can be treated with antibiotics by mouth.  Will do doxycycline twice daily for 10 days.  Keep dermatology appointment for next Tuesday for evaluation of the cyst as this will need more formal excision to completely resolve the area.  May keep the dressing on the area if drainage occurs.  May use Tylenol or ibuprofen for discomfort.  Return to urgent care or PCP if symptoms worsen or fail to resolve.  Final Clinical Impressions(s) / UC Diagnoses   Final diagnoses:  Infected epithelial inclusion cyst     Discharge Instructions      This appears to be an infected epidermal inclusion cyst or skin cyst.  The infection is mild at this point and can be treated with antibiotics by mouth.  Will do doxycycline twice daily for 10 days.  Keep dermatology appointment for next Tuesday for evaluation of the cyst as this will need more formal excision to completely resolve the area.  May keep the dressing on the area if drainage occurs.  May use Tylenol or ibuprofen for discomfort.  Return to urgent care or PCP if symptoms worsen or fail to resolve.     ED Prescriptions     Medication Sig Dispense Auth. Provider   doxycycline (VIBRAMYCIN) 100 MG capsule Take 1 capsule (100 mg total) by mouth 2 (two) times daily. 20 capsule Landis Martins, [place]      PDMP not reviewed this encounter.   Landis Martins, [place] 10/11/23 1619

## 2023-10-11 NOTE — Discharge Instructions (Addendum)
This appears to be an infected epidermal inclusion cyst or skin cyst.  The infection is mild at this point and can be treated with antibiotics by mouth.  Will do doxycycline twice daily for 10 days.  Keep dermatology appointment for next Tuesday for evaluation of the cyst as this will need more formal excision to completely resolve the area.  May keep the dressing on the area if drainage occurs.  May use Tylenol or ibuprofen for discomfort.  Return to urgent care or PCP if symptoms worsen or fail to resolve.

## 2023-10-11 NOTE — ED Triage Notes (Signed)
Reports a bump on her back for 3 weeks, but today is oozing .    For the last 3 days, patient has noticed discomfort when leaning back against chairs  Patient has a dermatology appt coming up soon

## 2023-10-19 ENCOUNTER — Telehealth: Payer: Self-pay

## 2023-10-19 ENCOUNTER — Ambulatory Visit (INDEPENDENT_AMBULATORY_CARE_PROVIDER_SITE_OTHER): Payer: 59 | Admitting: Dermatology

## 2023-10-19 ENCOUNTER — Encounter: Payer: Self-pay | Admitting: Dermatology

## 2023-10-19 VITALS — BP 117/77 | HR 60

## 2023-10-19 DIAGNOSIS — L02212 Cutaneous abscess of back [any part, except buttock]: Secondary | ICD-10-CM | POA: Diagnosis not present

## 2023-10-19 DIAGNOSIS — I10 Essential (primary) hypertension: Secondary | ICD-10-CM

## 2023-10-19 MED ORDER — DOXYCYCLINE HYCLATE 100 MG PO TABS: 100.0000 mg | ORAL_TABLET | Freq: Two times a day (BID) | ORAL | 0 refills | Status: AC

## 2023-10-19 MED ORDER — HYDROCHLOROTHIAZIDE 25 MG PO TABS: 25.0000 mg | ORAL_TABLET | Freq: Every day | ORAL | 1 refills | Status: DC

## 2023-10-19 MED ORDER — LISINOPRIL 40 MG PO TABS: 40.0000 mg | ORAL_TABLET | Freq: Every day | ORAL | 1 refills | Status: DC

## 2023-10-19 NOTE — Addendum Note (Signed)
Addended by: Saralyn Pilar on: 10/19/2023 04:37 PM   Modules accepted: Orders

## 2023-10-19 NOTE — Progress Notes (Signed)
   Follow-Up Visit   Subjective  Kimberly Bush is a 62 y.o. female who presents for the following: Cyst  Patient present today for follow up visit for Cyst. Patient was last evaluated by UC on 10/11/23. She was prescribed Doxycycline. She took it for 3 days prior to her having GI irritation then she stopped. She states the cyst has been there for 1 month. It gradually got worse and started draining. Patient  medication changes. Patient reports she uses Bath and body works liquid body gel or Goat milk bar soap.   The following portions of the chart were reviewed this encounter and updated as appropriate: medications, allergies, medical history  Review of Systems:  No other skin or systemic complaints except as noted in HPI or Assessment and Plan.  Objective  Well appearing patient in no apparent distress; mood and affect are within normal limits.  A focused examination was performed of the following areas: Back  Relevant exam findings are noted in the Assessment and Plan.  Mid Back Large indurated flocculent nodule on central back     Assessment & Plan   Abscess of back Mid Back  Exam: Subcutaneous nodule at Mid to upper back, Symptomatic, irritating, patient would like treated.  Treatment Plan: - Recommended continuing doxycycline - We will plan to get a culture cyst today, We will call with results - Benign-appearing.   Call clinic for new or changing lesions.   Diagnosed onset of a cyst turned abscess.  Counseled pt on the benign nature of cysts and the risk of potential repeated infections. The time spent counseling this attention is separate from any procedure performed today   Aerobic Culture - Mid Back  doxycycline (VIBRA-TABS) 100 MG tablet - Mid Back Take 1 tablet (100 mg total) by mouth 2 (two) times daily for 5 days. TAKE WITH HEAVY MEALS  Incision and Drainage - Mid Back Location: Mid to upper back  Informed Consent: Discussed risks (permanent scarring, light  or dark discoloration, infection, pain, bleeding, bruising, redness, damage to adjacent structures, and recurrence of the lesion) and benefits of the procedure, as well as the alternatives.  Informed consent was obtained.  Preparation: The area was prepped with alcohol.  Anesthesia: Lidocaine 1% with epinephrine  Procedure Details: An incision was made overlying the lesion. The lesion drained pus and cyst material. The area was flushed with sterile saline. Ointment and a sterile pressure dressing were applied. The patient tolerated procedure well.  Total number of lesions drained: 1  Plan: The patient was instructed on post-op care. Recommend OTC analgesia as needed for pain.    No follow-ups on file.   Documentation: I have reviewed the above documentation for accuracy and completeness, and I agree with the above.  Stasia Cavalier, am acting as scribe for Langston Reusing, DO.   Langston Reusing, DO

## 2023-10-19 NOTE — Telephone Encounter (Signed)
It appears that she is already taking both of those medications.

## 2023-10-19 NOTE — Telephone Encounter (Signed)
She should only be taking 40 mg of lisinopril daily as that is the maximum daily dose.  If she is needing to have stronger medication to control her blood pressure, I would suggest continuing lisinopril 40 mg once daily and add hydrochlorothiazide 12.5 mg once daily.

## 2023-10-19 NOTE — Telephone Encounter (Signed)
Pt is requesting to have a refill of lisinopril (ZESTRIL) 60mg   tablet  sent in so she can be taking just one tablet.   Currently patient has been taking 20mg  in the morning and 40mg  in the evening.   Pharmacy [facility] [id number] - [place], [place] - [address]

## 2023-10-19 NOTE — Telephone Encounter (Signed)
She should be taking lisinopril 40 mg daily and hydrochlorothiazide 25 mg daily.

## 2023-10-19 NOTE — Patient Instructions (Addendum)
Patient Handout: Wound Care for Skin I&D Site  Taking Care of Your I&D Site  Proper care of the I&D site is essential for promoting healing and minimizing scarring. This handout provides instructions on how to care for your I&D site to ensure optimal recovery.  1. Cleaning the Wound:  Clean the I&D site daily with gentle soap and water. Gently pat the area dry with a clean, soft towel. Avoid harsh scrubbing or rubbing the area, as this can irritate the skin and delay healing.  2. Applying Aquaphor and Bandage:  After cleaning the wound, apply a thin layer of Aquaphor ointment to the I& D site. Cover the area with a sterile bandage to protect it from dirt, bacteria, and friction. Change the bandage daily or as needed if it becomes soiled or wet.  3. Continued Care for One Week:  Repeat the cleaning, Aquaphor application, and bandaging process daily for one week following the I&D procedure. Keeping the wound clean and moist during this initial healing period will help prevent infection and promote optimal healing.  4. Massaging Aquaphor into the Area:  ---After one week, discontinue the use of bandages but continue to apply Aquaphor to the I& D site. ----Gently massage the Aquaphor into the area using circular motions. ---Massaging the skin helps to promote circulation and prevent the formation of scar tissue.   Additional Tips:  Avoid exposing the biopsy site to direct sunlight during the healing process, as this can cause hyperpigmentation or worsen scarring. If you experience any signs of infection, such as increased redness, swelling, warmth, or drainage from the wound, contact your healthcare provider immediately. Follow any additional instructions provided by your healthcare provider for caring for the biopsy site and managing any discomfort. Conclusion:  Taking proper care of your skin biopsy site is crucial for ensuring optimal healing and minimizing scarring. By  following these instructions for cleaning, applying Aquaphor, and massaging the area, you can promote a smooth and successful recovery. If you have any questions or concerns about caring for your biopsy site, don't hesitate to contact your healthcare provider for guidance.    Important Information   Due to recent changes in healthcare laws, you may see results of your pathology and/or laboratory studies on MyChart before the doctors have had a chance to review them. We understand that in some cases there may be results that are confusing or concerning to you. Please understand that not all results are received at the same time and often the doctors may need to interpret multiple results in order to provide you with the best plan of care or course of treatment. Therefore, we ask that you please give [place] 2 business days to thoroughly review all your results before contacting the office for clarification. Should we see a critical lab result, you will be contacted sooner.     If You Need Anything After Your Visit   If you have any questions or concerns for your doctor, please call our main line at [phone]. If no one answers, please leave a voicemail as directed and we will return your call as soon as possible. Messages left after 4 pm will be answered the following business day.    You may also send [place] a message via MyChart. We typically respond to MyChart messages within 1-2 business days.  For prescription refills, please ask your pharmacy to contact our office. Our fax number is [phone].  If you have an urgent issue when the clinic is closed that  cannot wait until the next business day, you can page your doctor at the number below.     Please note that while we do our best to be available for urgent issues outside of office hours, we are not available 24/7.    If you have an urgent issue and are unable to reach [place], you may choose to seek medical care at your doctor's office, retail clinic,  urgent care center, or emergency room.   If you have a medical emergency, please immediately call 911 or go to the emergency department. In the event of inclement weather, please call our main line at [phone] for an update on the status of any delays or closures.  Dermatology Medication Tips: Please keep the boxes that topical medications come in in order to help keep track of the instructions about where and how to use these. Pharmacies typically print the medication instructions only on the boxes and not directly on the medication tubes.   If your medication is too expensive, please contact our office at [phone] or send [place] a message through MyChart.    We are unable to tell what your co-pay for medications will be in advance as this is different depending on your insurance coverage. However, we may be able to find a substitute medication at lower cost or fill out paperwork to get insurance to cover a needed medication.    If a prior authorization is required to get your medication covered by your insurance company, please allow [place] 1-2 business days to complete this process.   Drug prices often vary depending on where the prescription is filled and some pharmacies may offer cheaper prices.   The website www.goodrx.com contains coupons for medications through different pharmacies. The prices here do not account for what the cost may be with help from insurance (it may be cheaper with your insurance), but the website can give you the price if you did not use any insurance.  - You can print the associated coupon and take it with your prescription to the pharmacy.  - You may also stop by our office during regular business hours and pick up a GoodRx coupon card.  - If you need your prescription sent electronically to a different pharmacy, notify our office through [facility] or by phone at [phone]

## 2023-10-22 LAB — AEROBIC CULTURE

## 2023-11-01 ENCOUNTER — Other Ambulatory Visit: Payer: Self-pay

## 2023-11-01 DIAGNOSIS — K219 Gastro-esophageal reflux disease without esophagitis: Secondary | ICD-10-CM

## 2023-11-01 MED ORDER — OMEPRAZOLE 40 MG PO CPDR: 40.0000 mg | DELAYED_RELEASE_CAPSULE | Freq: Every day | ORAL | 1 refills | Status: DC

## 2023-11-22 ENCOUNTER — Other Ambulatory Visit: Payer: 59

## 2023-11-22 DIAGNOSIS — I10 Essential (primary) hypertension: Secondary | ICD-10-CM

## 2023-11-22 DIAGNOSIS — R944 Abnormal results of kidney function studies: Secondary | ICD-10-CM

## 2023-11-22 DIAGNOSIS — Z1159 Encounter for screening for other viral diseases: Secondary | ICD-10-CM

## 2023-11-23 LAB — COMPREHENSIVE METABOLIC PANEL
ALT: 15 [IU]/L (ref 0–32)
AST: 22 [IU]/L (ref 0–40)
Albumin: 4.5 g/dL (ref 3.8–4.9)
Alkaline Phosphatase: 108 [IU]/L (ref 44–121)
BUN/Creatinine Ratio: 10 — ABNORMAL LOW (ref 12–28)
BUN: 10 mg/dL (ref 8–27)
Bilirubin Total: 1.5 mg/dL — ABNORMAL HIGH (ref 0.0–1.2)
CO2: 20 mmol/L (ref 20–29)
Calcium: 9.2 mg/dL (ref 8.7–10.3)
Chloride: 102 mmol/L (ref 96–106)
Creatinine, Ser: 0.97 mg/dL (ref 0.57–1.00)
Globulin, Total: 2 g/dL (ref 1.5–4.5)
Glucose: 108 mg/dL — ABNORMAL HIGH (ref 70–99)
Potassium: 3.8 mmol/L (ref 3.5–5.2)
Sodium: 140 mmol/L (ref 134–144)
Total Protein: 6.5 g/dL (ref 6.0–8.5)
eGFR: 67 mL/min/{1.73_m2} (ref >59)

## 2023-11-23 LAB — HEPATITIS C ANTIBODY: Hep C Virus Ab: NONREACTIVE

## 2023-11-29 ENCOUNTER — Ambulatory Visit (INDEPENDENT_AMBULATORY_CARE_PROVIDER_SITE_OTHER): Payer: 59 | Admitting: Allergy

## 2023-11-29 ENCOUNTER — Encounter: Payer: Self-pay | Admitting: Allergy

## 2023-11-29 ENCOUNTER — Other Ambulatory Visit: Payer: Self-pay

## 2023-11-29 VITALS — BP 120/86 | HR 95 | Temp 98.0°F | Resp 20 | Ht 61.81 in | Wt 158.0 lb

## 2023-11-29 DIAGNOSIS — R053 Chronic cough: Secondary | ICD-10-CM | POA: Diagnosis not present

## 2023-11-29 DIAGNOSIS — H1013 Acute atopic conjunctivitis, bilateral: Secondary | ICD-10-CM

## 2023-11-29 DIAGNOSIS — J301 Allergic rhinitis due to pollen: Secondary | ICD-10-CM | POA: Diagnosis not present

## 2023-11-29 DIAGNOSIS — J3089 Other allergic rhinitis: Secondary | ICD-10-CM

## 2023-11-29 DIAGNOSIS — J3081 Allergic rhinitis due to animal (cat) (dog) hair and dander: Secondary | ICD-10-CM

## 2023-11-29 MED ORDER — OLOPATADINE HCL 0.2 % OP SOLN: 1.0000 [drp] | Freq: Every day | OPHTHALMIC | 5 refills | Status: DC | PRN

## 2023-11-29 MED ORDER — MONTELUKAST SODIUM 10 MG PO TABS: 10.0000 mg | ORAL_TABLET | Freq: Every day | ORAL | 5 refills | Status: DC

## 2023-11-29 MED ORDER — FLUTICASONE PROPIONATE 50 MCG/ACT NA SUSP
1.0000 | Freq: Every day | NASAL | 5 refills | Status: AC | PRN
Start: 2023-11-29 — End: ?

## 2023-11-29 NOTE — Progress Notes (Signed)
New Patient Note  RE: Kimberly Bush MRN: [id number] DOB: Apr 17, 1961 Date of Office Visit: 11/29/2023  Consult requested by: Melida Quitter, PA Primary care provider: Melida Quitter, PA  Chief Complaint: Advice Only, Allergies, and Rash  History of Present Illness: I had the pleasure of seeing Kimberly Bush for initial evaluation at the [facility] on 11/29/2023. She is a 62 y.o. female, who is referred here by Melida Quitter, PA for the evaluation of environmental allergies.  Discussed the use of AI scribe software for clinical note transcription with the patient, who gave verbal consent to proceed.  The patient presents with symptoms consistent with allergic rhinitis, exacerbated by exposure to cats. She reports a long-standing history of cat allergies characterized by watery eyes, runny nose, sneezing, and difficulty breathing. The patient notes that these symptoms are particularly severe when in close proximity to cats, but are present to a lesser degree even when not directly exposed to cats. She has been managing these symptoms with daily Zyrtec and evening Benadryl, but reports that this regimen has become less effective since the addition of another cat in her household.  In addition to her allergic symptoms, the patient also reports a recurrent rash on both arms, which began after receiving a shingles vaccine. The rash, described as small red bumps, is itchy and has been managed with topical creams. The patient also reports dryness and burning on her face, for which she has seen a dermatologist and received treatment.  She has no known food allergies and reports a reaction to prednisone in the past, which resulted in multiple side effects and an emergency room visit. She also reports a reaction to yellow jacket stings as a child, which resulted in localized swelling at the site of the stings.   She reports symptoms of watery/itchy eyes, rhinorrhea, sneezing,  trouble breathing. Symptoms have been going on for many years. Other triggers include exposure to cats only. Anosmia: no. Headache: denies. She has used benadryl and zyrtec with some improvement in symptoms. Sinus infections: no. Previous work up includes: none. Previous ENT evaluation: no. Previous sinus imaging: no. History of nasal polyps: no. Last eye exam: not recently. History of reflux: sometimes.  Assessment and Plan: Kimberly Bush is a 62 y.o. female with: Other allergic rhinitis Seasonal allergic rhinitis due to pollen Allergic rhinitis due to animal dander Allergic rhinitis due to dust mite Allergic conjunctivitis of both eyes Chronic symptoms of watery eyes, runny nose, sneezing, and itchy eyes. Symptoms exacerbated by exposure to cats - 3 cats at home. Currently managed with daily Zyrtec and Benadryl.  Today's skin testing positive to trees, dust mites, cat, dog. Start environmental control measures as below. Use over the counter antihistamines such as Zyrtec (cetirizine), Claritin (loratadine), Allegra (fexofenadine), or Xyzal (levocetirizine) daily as needed. May take twice a day during allergy flares. May switch antihistamines every few months. Start Singulair (montelukast) 10mg  daily at night. Cautioned that in some children/adults can experience behavioral changes including hyperactivity, agitation, depression, sleep disturbances and suicidal ideations. These side effects are rare, but if you notice them you should notify me and discontinue Singulair (montelukast). Use Flonase (fluticasone) nasal spray 1-2 sprays per nostril once a day as needed for nasal congestion.  Nasal saline spray (i.e., Simply Saline) or nasal saline lavage (i.e., NeilMed) is recommended as needed and prior to medicated nasal sprays. Use olopatadine eye drops 0.2% once a day as needed for itchy/watery eyes. Consider allergy injections for long  term control if above medications do not help the symptoms -  handout given.  Let us know when ready to start.   Chronic cough Albuterol inhaler used during bronchitis in the summer. Normal spirometry today. Monitor symptoms.  If persistent, consider stopping lisinopril as that can cause a nagging cough.   Return in about 3 months (around 02/27/2024).  Meds ordered this encounter  Medications   fluticasone (FLONASE) 50 MCG/ACT nasal spray    Sig: Place 1-2 sprays into both nostrils daily as needed (nasal congestion).    Dispense:  16 g    Refill:  5   montelukast (SINGULAIR) 10 MG tablet    Sig: Take 1 tablet (10 mg total) by mouth at bedtime.    Dispense:  30 tablet    Refill:  5   Olopatadine HCl 0.2 % SOLN    Sig: Apply 1 drop to eye daily as needed (itchy/watery eyes).    Dispense:  2.5 mL    Refill:  5   Lab Orders  No laboratory test(s) ordered today    Other allergy screening: Asthma: no Albuterol inhaler used during bronchitis in the summer.  Food allergy: no Medication allergy:  prednisone - side effects. Tolerates medrol pak Hymenoptera allergy: no Urticaria: no History of recurrent infections suggestive of immunodeficency: no  Diagnostics: Spirometry:  Tracings reviewed. Her effort: Good reproducible efforts. FVC: 2.42L FEV1: 2.02L, 89% predicted FEV1/FVC ratio: 83% Interpretation: Spirometry consistent with normal pattern.  Please see scanned spirometry results for details.  Skin Testing: Environmental allergy panel. Today's skin testing positive to trees, dust mites, cat, dog. Results discussed with patient/family.    Row Name 11/29/23 1427  Test Information  Allergen Manufacturer Greer  Location Back  Number of Test 55  Panel 1 Select  Routine  1. Control-Buffer 50% Glycerol Negative  2. Control-Histamine 3+  Grasses  3. Bahia Negative  4. French Southern Territories Negative  5. Johnson Negative  6. Kentucky Blue Negative  7. Meadow Fescue Negative  8. Perennial Rye Negative  9. Timothy Negative  Weeds  10.  Ragweed Mix Negative  11. Cocklebur Negative  12. Plantain,  English Negative  13. Baccharis Negative  14. Dog Fennel Negative  15. Russian Thistle Negative  16. Lamb's Quarters Negative  17. Sheep Sorrell Negative  18. Rough Pigweed Negative  19. Marsh Elder, Rough Negative  20. Mugwort, Common Negative  Trees  21. Box, Elder Negative  22. Cedar, red 2+  23. Sweet Gum Negative  24. Pecan Pollen Negative  25. Pine Mix Negative  26. Walnut, Black Pollen Negative  27. Red Mulberry Negative  28. Ash Mix Negative  29. Birch Mix Negative  30. Beech American Negative  31. Cottonwood, Guinea-Bissau Negative  32. Hickory, White Negative  33. Maple Mix Negative  34. Oak, Guinea-Bissau Mix Negative  35. Sycamore Eastern Negative  Major Molds Mix (seasonal) 1  36. Alternaria Alternata Negative  37. Cladosporium Herbarum Negative  Major Molds Mix (perennial ) 2  38. Aspergillus Mix Negative  39. Penicillium Mix Negative  Major Mold Mix (seasonal) 3  40. Bipolaris Sorokiniana (Helminthosporium) Negative  41. Drechslera Spicifera (Curvularia) Negative  42. Mucor Plumbeus Negative  Major Molds Mix (perennial ) 4  43. Fusarium Moniliforme Negative  44. Aureobasidium Pullulans (pullulara) Negative  45. Rhizopus Oryzae Negative  Other Molds  46. Botrytis Cinera Negative  47. Epicoccum Nigrum Negative  48. Phoma Betae Negative  Inhalants  49. Dust Mite Mix 3+  50. Cat Hair 10,000 BAU/ml 2+  51.  Dog Epithelia Negative  52. Mixed Feathers Negative  53. Horse Epithelia Negative  54. Cockroach, German Negative  55. Tobacco Leaf Negative    Intradermal - 11/29/23 1458     Time Antigen Placed 1506    Allergen Manufacturer Waynette Buttery    Location Arm    Number of Test 14    Control Negative    Bahia Negative    French Southern Territories Negative    Johnson Negative    7 Grass Negative    Ragweed Mix Negative    Weed Mix Negative    Tree Mix Negative    Mold 1 Negative    Mold 2 Negative    Mold 3 Negative     Mold 4 Negative    Dog 2+    Cockroach Negative         Past Medical History: Patient Active Problem List   Diagnosis Date Noted   Hyperlipidemia LDL goal <100 04/05/2022   Gastroesophageal reflux disease without esophagitis 02/21/2022   Essential hypertension 01/16/2022   Body mass index 29.0-29.9, adult 01/16/2022   Family history of early CAD 01/16/2022   Past Medical History:  Diagnosis Date   Hypertension    Seasonal allergies    Past Surgical History: Past Surgical History:  Procedure Laterality Date   BACK SURGERY     Medication List:  Current Outpatient Medications  Medication Sig Dispense Refill   Cetirizine HCl (ZYRTEC PO) Take by mouth.     clobetasol cream (TEMOVATE) 0.05 % Apply 1 Application topically 2 (two) times daily. 30 g 0   diphenhydrAMINE HCl (BENADRYL PO) Take by mouth.     fluticasone (FLONASE) 50 MCG/ACT nasal spray Place 1-2 sprays into both nostrils daily as needed (nasal congestion). 16 g 5   hydrochlorothiazide (HYDRODIURIL) 25 MG tablet Take 1 tablet (25 mg total) by mouth daily. 90 tablet 1   hydrocortisone 2.5 % cream Apply topically 2 (two) times daily as needed (Rash). 30 g 5   lisinopril (ZESTRIL) 40 MG tablet Take 1 tablet (40 mg total) by mouth daily. 90 tablet 1   montelukast (SINGULAIR) 10 MG tablet Take 1 tablet (10 mg total) by mouth at bedtime. 30 tablet 5   Olopatadine HCl 0.2 % SOLN Apply 1 drop to eye daily as needed (itchy/watery eyes). 2.5 mL 5   omeprazole (PRILOSEC) 40 MG capsule Take 1 capsule (40 mg total) by mouth daily. 90 capsule 1   triamcinolone cream (KENALOG) 0.1 % Apply 1 Application topically 2 (two) times daily. 30 g 0   No current facility-administered medications for this visit.   Allergies: Allergies  Allergen Reactions   Prednisone     Over heating and bleeding   Social History: Social History   Socioeconomic History   Marital status: Single    Spouse name: Not on file   Number of children: Not  on file   Years of education: Not on file   Highest education level: 12th grade  Occupational History   Not on file  Tobacco Use   Smoking status: Never    Passive exposure: Never   Smokeless tobacco: Never  Vaping Use   Vaping status: Never Used  Substance and Sexual Activity   Alcohol use: Not Currently    Comment: very occasionally   Drug use: No   Sexual activity: Not Currently  Other Topics Concern   Not on file  Social History Narrative   Not on file   Social Drivers of Health  Financial Resource Strain: Low Risk  (08/22/2023)   Overall Financial Resource Strain (CARDIA)    Difficulty of Paying Living Expenses: Not hard at all  Food Insecurity: No Food Insecurity (08/22/2023)   Hunger Vital Sign    Worried About Running Out of Food in the Last Year: Never true    Ran Out of Food in the Last Year: Never true  Transportation Needs: No Transportation Needs (08/22/2023)   PRAPARE - Administrator, Civil Service (Medical): No    Lack of Transportation (Non-Medical): No  Physical Activity: Sufficiently Active (08/22/2023)   Exercise Vital Sign    Days of Exercise per Week: 5 days    Minutes of Exercise per Session: 60 min  Stress: Not on file  Social Connections: Moderately Integrated (08/22/2023)   Social Connection and Isolation Panel [NHANES]    Frequency of Communication with Friends and Family: More than three times a week    Frequency of Social Gatherings with Friends and Family: Three times a week    Attends Religious Services: More than 4 times per year    Active Member of Clubs or Organizations: Yes    Attends Banker Meetings: More than 4 times per year    Marital Status: Divorced   Lives in a townhome. Smoking: denies Occupation: retired  Landscape architect HistorySurveyor, minerals in the house: no Engineer, civil (consulting) in the family room: no Carpet in the bedroom: yes Heating: electric Cooling: central Pet: yes 1 cat indoors, 2 cats  indoors/outdoors. 1 dog x 5 yrs  Family History: Family History  Problem Relation Age of Onset   Hypertension Mother    Eczema Nephew    Allergic rhinitis Neg Hx    Angioedema Neg Hx    Asthma Neg Hx    Urticaria Neg Hx    Review of Systems  Constitutional:  Negative for appetite change, chills, fever and unexpected weight change.  HENT:  Positive for rhinorrhea and sneezing.   Eyes:  Positive for itching.  Respiratory:  Positive for cough. Negative for chest tightness, shortness of breath and wheezing.   Cardiovascular:  Negative for chest pain.  Gastrointestinal:  Negative for abdominal pain.  Genitourinary:  Negative for difficulty urinating.  Skin:  Negative for rash.  Allergic/Immunologic: Positive for environmental allergies.  Neurological:  Negative for headaches.    Objective: BP 120/86 (BP Location: Left Arm, Patient Position: Sitting, Cuff Size: Normal)   Pulse 95   Temp 98 F (36.7 C) (Temporal)   Resp 20   Ht 5' 1.81" (1.57 m)   Wt 158 lb (71.7 kg)   LMP  (LMP Unknown)   SpO2 97%   BMI 29.08 kg/m  Body mass index is 29.08 kg/m. Physical Exam Vitals and nursing note reviewed.  Constitutional:      Appearance: Normal appearance. She is well-developed.  HENT:     Head: Normocephalic and atraumatic.     Right Ear: Tympanic membrane and external ear normal.     Left Ear: Tympanic membrane and external ear normal.     Nose: Nose normal.     Mouth/Throat:     Mouth: Mucous membranes are moist.     Pharynx: Oropharynx is clear.  Eyes:     Conjunctiva/sclera: Conjunctivae normal.  Cardiovascular:     Rate and Rhythm: Normal rate and regular rhythm.     Heart sounds: Normal heart sounds. No murmur heard.    No friction rub. No gallop.  Pulmonary:     Effort:  Pulmonary effort is normal.     Breath sounds: Normal breath sounds. No wheezing, rhonchi or rales.  Musculoskeletal:     Cervical back: Neck supple.  Skin:    General: Skin is warm.     Findings:  No rash.     Comments: Dry patch on right antecubital fossa area.   Neurological:     Mental Status: She is alert and oriented to person, place, and time.  Psychiatric:        Behavior: Behavior normal.   The plan was reviewed with the patient/family, and all questions/concerned were addressed.  It was my pleasure to see Kimberly Bush today and participate in her care. Please feel free to contact me with any questions or concerns.  Sincerely,  Wyline Mood, DO Allergy & Immunology  [facility] office: [phone] [facility] [place] office: [phone]

## 2023-11-29 NOTE — Patient Instructions (Addendum)
Today's skin testing positive to trees, dust mites, cat, dog.  Results given.  Environmental allergies Start environmental control measures as below. Use over the counter antihistamines such as Zyrtec (cetirizine), Claritin (loratadine), Allegra (fexofenadine), or Xyzal (levocetirizine) daily as needed. May take twice a day during allergy flares. May switch antihistamines every few months. Start Singulair (montelukast) 10mg  daily at night. Cautioned that in some children/adults can experience behavioral changes including hyperactivity, agitation, depression, sleep disturbances and suicidal ideations. These side effects are rare, but if you notice them you should notify me and discontinue Singulair (montelukast). Use Flonase (fluticasone) nasal spray 1-2 sprays per nostril once a day as needed for nasal congestion.  Nasal saline spray (i.e., Simply Saline) or nasal saline lavage (i.e., NeilMed) is recommended as needed and prior to medicated nasal sprays. Use olopatadine eye drops 0.2% once a day as needed for itchy/watery eyes. Consider allergy injections for long term control if above medications do not help the symptoms - handout given.  Let us know when ready to start.   Coughing Normal breathing test today. Monitor symptoms.   Return in about 3 months (around 02/27/2024). Or sooner if needed.   Control of House Dust Mite Allergen Dust mite allergens are a common trigger of allergy and asthma symptoms. While they can be found throughout the house, these microscopic creatures thrive in warm, humid environments such as bedding, upholstered furniture and carpeting. Because so much time is spent in the bedroom, it is essential to reduce mite levels there.  Encase pillows, mattresses, and box springs in special allergen-proof fabric covers or airtight, zippered plastic covers.  Bedding should be washed weekly in hot water (130 F) and dried in a hot dryer. Allergen-proof covers are available  for comforters and pillows that can't be regularly washed.  Wash the allergy-proof covers every few months. Minimize clutter in the bedroom. Keep pets out of the bedroom.  Keep humidity less than 50% by using a dehumidifier or air conditioning. You can buy a humidity measuring device called a hygrometer to monitor this.  If possible, replace carpets with hardwood, linoleum, or washable area rugs. If that's not possible, vacuum frequently with a vacuum that has a HEPA filter. Remove all upholstered furniture and non-washable window drapes from the bedroom. Remove all non-washable stuffed toys from the bedroom.  Wash stuffed toys weekly.  Pet Allergen Avoidance: Contrary to popular opinion, there are no "hypoallergenic" breeds of dogs or cats. That is because people are not allergic to an animal's hair, but to an allergen found in the animal's saliva, dander (dead skin flakes) or urine. Pet allergy symptoms typically occur within minutes. For some people, symptoms can build up and become most severe 8 to 12 hours after contact with the animal. People with severe allergies can experience reactions in public places if dander has been transported on the pet owners' clothing. Keeping an animal outdoors is only a partial solution, since homes with pets in the yard still have higher concentrations of animal allergens. Before getting a pet, ask your allergist to determine if you are allergic to animals. If your pet is already considered part of your family, try to minimize contact and keep the pet out of the bedroom and other rooms where you spend a great deal of time. As with dust mites, vacuum carpets often or replace carpet with a hardwood floor, tile or linoleum. High-efficiency particulate air (HEPA) cleaners can reduce allergen levels over time. While dander and saliva are the source of cat  and dog allergens, urine is the source of allergens from rabbits, hamsters, mice and [place] pigs; so ask a  non-allergic family member to clean the animal's cage. If you have a pet allergy, talk to your allergist about the potential for allergy immunotherapy (allergy shots). This strategy can often provide long-term relief.  Reducing Pollen Exposure Pollen seasons: trees (spring), grass (summer) and ragweed/weeds (fall). Keep windows closed in your home and car to lower pollen exposure.  Install air conditioning in the bedroom and throughout the house if possible.  Avoid going out in dry windy days - especially early morning. Pollen counts are highest between 5 - 10 AM and on dry, hot and windy days.  Save outside activities for late afternoon or after a heavy rain, when pollen levels are lower.  Avoid mowing of grass if you have grass pollen allergy. Be aware that pollen can also be transported indoors on people and pets.  Dry your clothes in an automatic dryer rather than hanging them outside where they might collect pollen.  Rinse hair and eyes before bedtime.

## 2023-12-19 ENCOUNTER — Other Ambulatory Visit: Payer: Self-pay

## 2023-12-19 ENCOUNTER — Ambulatory Visit
Admission: EM | Admit: 2023-12-19 | Discharge: 2023-12-19 | Disposition: A | Discharge status: Home / Self Care | Payer: 59 | Attending: Physician Assistant | Admitting: Physician Assistant

## 2023-12-19 ENCOUNTER — Encounter: Payer: Self-pay | Admitting: Emergency Medicine

## 2023-12-19 DIAGNOSIS — K529 Noninfective gastroenteritis and colitis, unspecified: Secondary | ICD-10-CM | POA: Diagnosis not present

## 2023-12-19 LAB — POCT INFLUENZA A/B
Influenza A, POC: NEGATIVE
Influenza B, POC: NEGATIVE

## 2023-12-19 MED ORDER — ONDANSETRON 4 MG PO TBDP: 4.0000 mg | ORAL_TABLET | Freq: Three times a day (TID) | ORAL | 0 refills | Status: DC | PRN

## 2023-12-19 MED ORDER — ONDANSETRON 4 MG PO TBDP: 2.0000 mg | ORAL_TABLET | Freq: Once | ORAL | Status: DC

## 2023-12-19 MED ORDER — ONDANSETRON 4 MG PO TBDP
4.0000 mg | ORAL_TABLET | Freq: Once | ORAL | Status: AC
  Administered 2023-12-19: 4 mg via ORAL

## 2023-12-19 NOTE — ED Provider Notes (Signed)
 [facility]    CSN: [id number] Arrival date & time: 12/19/23  0901      History   Chief Complaint Chief Complaint  Patient presents with   Diarrhea   Generalized Body Aches    HPI Kimberly Bush is a 63 y.o. female.   Patient here today for evaluation nausea, vomiting, diarrhea that started earlier this morning.  She has had some chills and bodyaches as well.  She denies any significant cough or congestion.  Her partner had flu about 10 days ago.  She has not had any fever.  The history is provided by the patient.  Diarrhea Associated symptoms: myalgias and vomiting   Associated symptoms: no abdominal pain, no chills and no fever     Past Medical History:  Diagnosis Date   Hypertension    Seasonal allergies     Patient Active Problem List   Diagnosis Date Noted   Hyperlipidemia LDL goal <100 04/05/2022   Gastroesophageal reflux disease without esophagitis 02/21/2022   Essential hypertension 01/16/2022   Body mass index 29.0-29.9, adult 01/16/2022   Family history of early CAD 01/16/2022    Past Surgical History:  Procedure Laterality Date   BACK SURGERY      OB History   No obstetric history on file.      Home Medications    Prior to Admission medications   Medication Sig Start Date End Date Taking? Authorizing Provider  ondansetron  (ZOFRAN -ODT) 4 MG disintegrating tablet Take 1 tablet (4 mg total) by mouth every 8 (eight) hours as needed. 12/19/23  Yes Billy Asberry FALCON, PA-C  Cetirizine HCl (ZYRTEC PO) Take by mouth.    [provider]  clobetasol  cream (TEMOVATE ) 0.05 % Apply 1 Application topically 2 (two) times daily. 09/09/23   Alm Delon SAILOR, DO  diphenhydrAMINE HCl (BENADRYL PO) Take by mouth.    [provider]  fluticasone  (FLONASE ) 50 MCG/ACT nasal spray Place 1-2 sprays into both nostrils daily as needed (nasal congestion). 11/29/23   Luke Orlan HERO, DO  hydrochlorothiazide  (HYDRODIURIL ) 25 MG tablet Take 1 tablet (25 mg  total) by mouth daily. 10/19/23   Wallace Joesph LABOR, PA  hydrocortisone  2.5 % cream Apply topically 2 (two) times daily as needed (Rash). 09/09/23   Alm Delon SAILOR, DO  lisinopril  (ZESTRIL ) 40 MG tablet Take 1 tablet (40 mg total) by mouth daily. 10/19/23   Wallace Joesph LABOR, PA  montelukast  (SINGULAIR ) 10 MG tablet Take 1 tablet (10 mg total) by mouth at bedtime. 11/29/23   Luke Orlan HERO, DO  Olopatadine  HCl 0.2 % SOLN Apply 1 drop to eye daily as needed (itchy/watery eyes). 11/29/23   Luke Orlan HERO, DO  omeprazole  (PRILOSEC) 40 MG capsule Take 1 capsule (40 mg total) by mouth daily. 11/01/23   Wallace Joesph LABOR, PA  triamcinolone  cream (KENALOG ) 0.1 % Apply 1 Application topically 2 (two) times daily. 07/14/23   Hazen Darryle BRAVO, FNP    Family History Family History  Problem Relation Age of Onset   Hypertension Mother    Eczema Nephew    Allergic rhinitis Neg Hx    Angioedema Neg Hx    Asthma Neg Hx    Urticaria Neg Hx     Social History Social History   Tobacco Use   Smoking status: Never    Passive exposure: Never   Smokeless tobacco: Never  Vaping Use   Vaping status: Never Used  Substance Use Topics   Alcohol use: Not Currently  Comment: very occasionally   Drug use: No     Allergies   Prednisone   Review of Systems Review of Systems  Constitutional:  Negative for chills and fever.  HENT:  Negative for congestion and sore throat.   Eyes:  Negative for discharge and redness.  Respiratory:  Negative for cough and shortness of breath.   Gastrointestinal:  Positive for diarrhea, nausea and vomiting. Negative for abdominal pain.  Genitourinary:  Negative for vaginal bleeding and vaginal discharge.  Musculoskeletal:  Positive for myalgias.     Physical Exam Triage Vital Signs ED Triage Vitals [12/19/23 1111]  Encounter Vitals Group     BP (!) 157/92     Systolic BP Percentile      Diastolic BP Percentile      Pulse Rate 84     Resp 18     Temp 97.6 F (36.4 C)      Temp Source Oral     SpO2 98 %     Weight      Height      Head Circumference      Peak Flow      Pain Score 5     Pain Loc      Pain Education      Exclude from Growth Chart    No data found.  Updated Vital Signs BP (!) 157/92 (BP Location: Left Arm)   Pulse 84   Temp 97.6 F (36.4 C) (Oral)   Resp 18   LMP  (LMP Unknown)   SpO2 98%   Visual Acuity Right Eye Distance:   Left Eye Distance:   Bilateral Distance:    Right Eye Near:   Left Eye Near:    Bilateral Near:     Physical Exam Vitals and nursing note reviewed.  Constitutional:      General: She is not in acute distress.    Appearance: Normal appearance. She is not ill-appearing.  HENT:     Head: Normocephalic and atraumatic.     Right Ear: Tympanic membrane normal.     Left Ear: Tympanic membrane normal.     Nose: Nose normal. No congestion or rhinorrhea.     Mouth/Throat:     Mouth: Mucous membranes are moist.     Pharynx: Oropharynx is clear. No oropharyngeal exudate or posterior oropharyngeal erythema.  Eyes:     Conjunctiva/sclera: Conjunctivae normal.  Cardiovascular:     Rate and Rhythm: Normal rate and regular rhythm.  Pulmonary:     Effort: Pulmonary effort is normal. No respiratory distress.     Breath sounds: No wheezing, rhonchi or rales.  Abdominal:     General: Abdomen is flat. Bowel sounds are normal. There is no distension.     Palpations: Abdomen is soft.     Tenderness: There is no abdominal tenderness. There is no guarding or rebound.  Neurological:     Mental Status: She is alert.  Psychiatric:        Mood and Affect: Mood normal.        Behavior: Behavior normal.        Thought Content: Thought content normal.      UC Treatments / Results  Labs (all labs ordered are listed, but only abnormal results are displayed) Labs Reviewed  POCT INFLUENZA A/B    EKG   Radiology No results found.  Procedures Procedures (including critical care time)  Medications  Ordered in UC Medications  ondansetron  (ZOFRAN -ODT) disintegrating tablet 4 mg (4 mg Oral  Given 12/19/23 1133)    Initial Impression / Assessment and Plan / UC Course  I have reviewed the triage vital signs and the nursing notes.  Pertinent labs & imaging results that were available during my care of the patient were reviewed by me and considered in my medical decision making (see chart for details).    Flu screening negative in office.  Suspect other viral etiology of symptoms and recommended symptomatic treatment, increase fluids and rest.  Zofran  prescribed for nausea.  Encouraged follow-up if no gradual improvement with any further concerns.  Discussed flu test might have been early given symptom onset within the last several hours, advised repeat testing should symptoms persist.  Final Clinical Impressions(s) / UC Diagnoses   Final diagnoses:  Gastroenteritis   Discharge Instructions   None    ED Prescriptions     Medication Sig Dispense Auth. Provider   ondansetron  (ZOFRAN -ODT) 4 MG disintegrating tablet Take 1 tablet (4 mg total) by mouth every 8 (eight) hours as needed. 20 tablet Billy Asberry FALCON, PA-C      PDMP not reviewed this encounter.   Billy Asberry FALCON, PA-C 12/19/23 1217

## 2023-12-19 NOTE — ED Triage Notes (Signed)
 Pt sts N/V/D starting today with chills and body aches; pt sts partner had flu and would like to be tested

## 2024-01-20 ENCOUNTER — Encounter: Payer: Self-pay | Admitting: Family Medicine

## 2024-01-27 ENCOUNTER — Other Ambulatory Visit: Payer: Self-pay

## 2024-01-27 DIAGNOSIS — Z0000 Encounter for general adult medical examination without abnormal findings: Secondary | ICD-10-CM

## 2024-01-27 DIAGNOSIS — I10 Essential (primary) hypertension: Secondary | ICD-10-CM

## 2024-01-27 DIAGNOSIS — E785 Hyperlipidemia, unspecified: Secondary | ICD-10-CM

## 2024-01-31 ENCOUNTER — Other Ambulatory Visit: Payer: Self-pay | Admitting: Family Medicine

## 2024-01-31 DIAGNOSIS — I10 Essential (primary) hypertension: Secondary | ICD-10-CM

## 2024-02-08 ENCOUNTER — Other Ambulatory Visit: Payer: 59

## 2024-02-08 DIAGNOSIS — I10 Essential (primary) hypertension: Secondary | ICD-10-CM

## 2024-02-08 DIAGNOSIS — Z0000 Encounter for general adult medical examination without abnormal findings: Secondary | ICD-10-CM

## 2024-02-08 DIAGNOSIS — E785 Hyperlipidemia, unspecified: Secondary | ICD-10-CM

## 2024-02-09 ENCOUNTER — Encounter: Payer: Self-pay | Admitting: Family Medicine

## 2024-02-09 LAB — CBC WITH DIFFERENTIAL/PLATELET
Basophils Absolute: 0.1 10*3/uL (ref 0.0–0.2)
Basos: 1 %
EOS (ABSOLUTE): 0.2 10*3/uL (ref 0.0–0.4)
Eos: 5 %
Hematocrit: 39.4 % (ref 34.0–46.6)
Hemoglobin: 13.4 g/dL (ref 11.1–15.9)
Immature Grans (Abs): 0 10*3/uL (ref 0.0–0.1)
Immature Granulocytes: 0 %
Lymphocytes Absolute: 1.3 10*3/uL (ref 0.7–3.1)
Lymphs: 26 %
MCH: 31.9 pg (ref 26.6–33.0)
MCHC: 34 g/dL (ref 31.5–35.7)
MCV: 94 fL (ref 79–97)
Monocytes Absolute: 0.3 10*3/uL (ref 0.1–0.9)
Monocytes: 7 %
Neutrophils Absolute: 3 10*3/uL (ref 1.4–7.0)
Neutrophils: 61 %
Platelets: 180 10*3/uL (ref 150–450)
RBC: 4.2 x10E6/uL (ref 3.77–5.28)
RDW: 12.8 % (ref 11.7–15.4)
WBC: 4.9 10*3/uL (ref 3.4–10.8)

## 2024-02-09 LAB — COMPREHENSIVE METABOLIC PANEL
ALT: 26 [IU]/L (ref 0–32)
AST: 30 [IU]/L (ref 0–40)
Albumin: 4.5 g/dL (ref 3.8–4.9)
Alkaline Phosphatase: 120 [IU]/L (ref 44–121)
BUN/Creatinine Ratio: 10 — ABNORMAL LOW (ref 12–28)
BUN: 9 mg/dL (ref 8–27)
Bilirubin Total: 1.1 mg/dL (ref 0.0–1.2)
CO2: 23 mmol/L (ref 20–29)
Calcium: 9.5 mg/dL (ref 8.7–10.3)
Chloride: 103 mmol/L (ref 96–106)
Creatinine, Ser: 0.94 mg/dL (ref 0.57–1.00)
Globulin, Total: 1.9 g/dL (ref 1.5–4.5)
Glucose: 96 mg/dL (ref 70–99)
Potassium: 4 mmol/L (ref 3.5–5.2)
Sodium: 138 mmol/L (ref 134–144)
Total Protein: 6.4 g/dL (ref 6.0–8.5)
eGFR: 69 mL/min/{1.73_m2} (ref >59)

## 2024-02-09 LAB — TSH: TSH: 2.13 u[IU]/mL (ref 0.450–4.500)

## 2024-02-09 LAB — HEMOGLOBIN A1C
Est. average glucose Bld gHb Est-mCnc: 111 mg/dL
Hgb A1c MFr Bld: 5.5 % (ref 4.8–5.6)

## 2024-02-09 LAB — LIPID PANEL
Chol/HDL Ratio: 5.4 {ratio} — ABNORMAL HIGH (ref 0.0–4.4)
Cholesterol, Total: 196 mg/dL (ref 100–199)
HDL: 36 mg/dL — ABNORMAL LOW (ref >39)
LDL Chol Calc (NIH): 130 mg/dL — ABNORMAL HIGH (ref 0–99)
Triglycerides: 164 mg/dL — ABNORMAL HIGH (ref 0–149)
VLDL Cholesterol Cal: 30 mg/dL (ref 5–40)

## 2024-02-14 ENCOUNTER — Other Ambulatory Visit: Payer: 59

## 2024-02-15 ENCOUNTER — Ambulatory Visit (INDEPENDENT_AMBULATORY_CARE_PROVIDER_SITE_OTHER): Payer: 59 | Admitting: Family Medicine

## 2024-02-15 ENCOUNTER — Encounter: Payer: Self-pay | Admitting: Family Medicine

## 2024-02-15 VITALS — BP 106/66 | HR 65 | Ht 61.81 in | Wt 155.0 lb

## 2024-02-15 DIAGNOSIS — E785 Hyperlipidemia, unspecified: Secondary | ICD-10-CM

## 2024-02-15 DIAGNOSIS — I10 Essential (primary) hypertension: Secondary | ICD-10-CM

## 2024-02-15 DIAGNOSIS — Z0000 Encounter for general adult medical examination without abnormal findings: Secondary | ICD-10-CM | POA: Diagnosis not present

## 2024-02-15 MED ORDER — LISINOPRIL 40 MG PO TABS: 40.0000 mg | ORAL_TABLET | Freq: Every day | ORAL | 1 refills | Status: DC

## 2024-02-15 MED ORDER — ATORVASTATIN CALCIUM 10 MG PO TABS: 10.0000 mg | ORAL_TABLET | Freq: Every day | ORAL | 0 refills | Status: DC

## 2024-02-15 MED ORDER — HYDROCHLOROTHIAZIDE 25 MG PO TABS: 25.0000 mg | ORAL_TABLET | Freq: Every day | ORAL | 1 refills | Status: AC

## 2024-02-15 NOTE — Assessment & Plan Note (Addendum)
 Lipid panel: LDL 130, HDL 36, triglycerides 164. The 10-year ASCVD risk score (Arnett DK, et al., 2019) is: 8.6%.  Given ASCVD risk score as well as family history of early CAD and personal history of hypertension, recommend to start statin therapy.  Patient is agreeable, start atorvastatin 10 mg daily as this is the medication that her mother took for many years and tolerated well.  In the future, may increase dose if necessary.

## 2024-02-15 NOTE — Progress Notes (Signed)
 Complete physical exam  Patient: Kimberly Bush   DOB: [birth year]   63 y.o. Female  MRN: [id number]  Subjective:    Chief Complaint  Patient presents with   Annual Exam    De Libman is a 63 y.o. female who presents today for a complete physical exam. She reports consuming a  balanced  diet.  She exercises on a regular basis several times each week.  She generally feels well. She reports sleeping well. She does not have additional problems to discuss today.  She would like to ensure that she is up-to-date on her MMR vaccine and see if she is eligible for the RSV vaccine.   Most recent fall risk assessment:    02/15/2024    3:01 PM  Fall Risk   Falls in the past year? 0  Number falls in past yr: 0  Injury with Fall? 0  Risk for fall due to : No Fall Risks  Follow up Falls evaluation completed     Most recent depression and anxiety screenings:    02/15/2024    3:01 PM 01/16/2022    8:30 AM  PHQ 2/9 Scores  PHQ - 2 Score 0 0  PHQ- 9 Score 0 4      02/15/2024    3:02 PM 01/16/2022    8:30 AM  GAD 7 : Generalized Anxiety Score  Nervous, Anxious, on Edge 1 1  Control/stop worrying 0 0  Worry too much - different things 1 0  Trouble relaxing  1  Restless 0 1  Easily annoyed or irritable 0 1  Afraid - awful might happen 0 0  Total GAD 7 Score  4  Anxiety Difficulty Not difficult at all Not difficult at all    Patient Active Problem List   Diagnosis Date Noted   Hyperlipidemia LDL goal <100 04/05/2022   Gastroesophageal reflux disease without esophagitis 02/21/2022   Essential hypertension 01/16/2022   Body mass index 29.0-29.9, adult 01/16/2022   Family history of early CAD 01/16/2022    Past Surgical History:  Procedure Laterality Date   BACK SURGERY     Social History   Tobacco Use   Smoking status: Never    Passive exposure: Never   Smokeless tobacco: Never  Vaping Use   Vaping status: Never Used  Substance Use Topics   Alcohol use: Not Currently    Comment:  very occasionally   Drug use: No   Family History  Problem Relation Age of Onset   Hypertension Mother    Hyperlipidemia Mother    Stroke Mother    Heart attack Mother    Eczema Nephew    Allergic rhinitis Neg Hx    Angioedema Neg Hx    Asthma Neg Hx    Urticaria Neg Hx    Allergies  Allergen Reactions   Prednisone     Over heating and bleeding     Patient Care Team: Melida Quitter, PA as PCP - General (Family Medicine)   Outpatient Medications Prior to Visit  Medication Sig   Cetirizine HCl (ZYRTEC PO) Take by mouth.   clobetasol cream (TEMOVATE) 0.05 % Apply 1 Application topically 2 (two) times daily.   diphenhydrAMINE HCl (BENADRYL PO) Take by mouth.   fluticasone (FLONASE) 50 MCG/ACT nasal spray Place 1-2 sprays into both nostrils daily as needed (nasal congestion).   hydrocortisone 2.5 % cream Apply topically 2 (two) times daily as needed (Rash).   montelukast (SINGULAIR) 10 MG tablet Take 1 tablet (10  mg total) by mouth at bedtime.   omeprazole (PRILOSEC) 40 MG capsule Take 1 capsule (40 mg total) by mouth daily.   ondansetron (ZOFRAN-ODT) 4 MG disintegrating tablet Take 1 tablet (4 mg total) by mouth every 8 (eight) hours as needed.   triamcinolone cream (KENALOG) 0.1 % Apply 1 Application topically 2 (two) times daily.   [DISCONTINUED] hydrochlorothiazide (HYDRODIURIL) 25 MG tablet TAKE 1 TABLET BY MOUTH DAILY   [DISCONTINUED] lisinopril (ZESTRIL) 40 MG tablet TAKE 1 TABLET BY MOUTH DAILY   [DISCONTINUED] Olopatadine HCl 0.2 % SOLN Apply 1 drop to eye daily as needed (itchy/watery eyes). (Patient not taking: Reported on 02/15/2024)   No facility-administered medications prior to visit.    Review of Systems  Constitutional:  Negative for chills, fever and malaise/fatigue.  HENT:  Negative for congestion and hearing loss.   Eyes:  Negative for blurred vision and double vision.  Respiratory:  Negative for cough and shortness of breath.   Cardiovascular:   Negative for chest pain, palpitations and leg swelling.  Gastrointestinal:  Negative for abdominal pain, constipation, diarrhea and heartburn.  Genitourinary:  Negative for frequency and urgency.  Musculoskeletal:  Negative for myalgias and neck pain.  Neurological:  Negative for headaches.  Endo/Heme/Allergies:  Negative for polydipsia.  Psychiatric/Behavioral:  Negative for depression. The patient is not nervous/anxious and does not have insomnia.       Objective:    BP 106/66   Pulse 65   Ht 5' 1.81" (1.57 m)   Wt 155 lb (70.3 kg)   LMP  (LMP Unknown)   SpO2 97%   BMI 28.52 kg/m    Physical Exam Constitutional:      General: She is not in acute distress.    Appearance: Normal appearance.  HENT:     Head: Normocephalic and atraumatic.     Right Ear: Tympanic membrane, ear canal and external ear normal.     Left Ear: Tympanic membrane, ear canal and external ear normal.     Nose: Nose normal.     Mouth/Throat:     Mouth: Mucous membranes are moist.     Pharynx: No oropharyngeal exudate or posterior oropharyngeal erythema.  Eyes:     Extraocular Movements: Extraocular movements intact.     Conjunctiva/sclera: Conjunctivae normal.     Pupils: Pupils are equal, round, and reactive to light.  Neck:     Thyroid: No thyroid mass, thyromegaly or thyroid tenderness.  Cardiovascular:     Rate and Rhythm: Normal rate and regular rhythm.     Heart sounds: Normal heart sounds. No murmur heard.    No friction rub. No gallop.  Pulmonary:     Effort: Pulmonary effort is normal. No respiratory distress.     Breath sounds: Normal breath sounds. No wheezing, rhonchi or rales.  Abdominal:     General: Abdomen is flat. Bowel sounds are normal. There is no distension.     Palpations: There is no mass.     Tenderness: There is no abdominal tenderness. There is no guarding.  Musculoskeletal:        General: Normal range of motion.     Cervical back: Normal range of motion and neck  supple.  Lymphadenopathy:     Cervical: No cervical adenopathy.  Skin:    General: Skin is warm and dry.  Neurological:     Mental Status: She is alert and oriented to person, place, and time.     Cranial Nerves: No cranial nerve deficit.  Motor: No weakness.     Deep Tendon Reflexes: Reflexes normal.  Psychiatric:        Mood and Affect: Mood normal.        Assessment & Plan:    Routine Health Maintenance and Physical Exam  Immunization History  Administered Date(s) Administered   Influenza Inj Mdck Quad Pf 10/15/2022   Influenza, Mdck, Trivalent,PF 6+ MOS(egg free) 08/13/2023   Influenza,inj,Quad PF,6+ Mos 01/20/2023   Influenza-Unspecified 08/13/2023   PFIZER Comirnaty(Gray Top)Covid-19 Tri-Sucrose Vaccine 03/26/2021   PFIZER(Purple Top)SARS-COV-2 Vaccination 02/23/2020, 03/15/2020, 03/26/2020, 10/30/2020, 09/24/2021   PNEUMOCOCCAL CONJUGATE-20 11/05/2023   Pfizer Covid-19 Vaccine Bivalent Booster 21yrs & up 09/24/2021   Pfizer(Comirnaty)Fall Seasonal Vaccine 12 years and older 10/15/2022, 08/13/2023   Tdap 03/31/2022   Zoster Recombinant(Shingrix) 08/06/2022, 01/20/2023    Health Maintenance  Topic Date Due   MAMMOGRAM  07/27/2025   Fecal DNA (Cologuard)  09/02/2025   Cervical Cancer Screening (HPV/Pap Cotest)  04/01/2027   DTaP/Tdap/Td (2 - Td or Tdap) 03/31/2032   INFLUENZA VACCINE  Completed   COVID-19 Vaccine  Completed   Hepatitis C Screening  Completed   HIV Screening  Completed   Zoster Vaccines- Shingrix  Completed   Pneumococcal Vaccine 67-50 Years old  Aged Out   HPV VACCINES  Aged Out    Reviewed most recent labs including CBC, CMP, lipid panel, A1C, TSH, and vitamin D. All within normal limits/stable from last check other than LDL 130, HDL 36, triglycerides 164.  Discussed health benefits of physical activity, and encouraged her to engage in regular exercise appropriate for her age and condition.  Wellness examination  Hyperlipidemia LDL goal  <100 Assessment & Plan: Lipid panel: LDL 130, HDL 36, triglycerides 164. The 10-year ASCVD risk score (Arnett DK, et al., 2019) is: 8.6%.  Given ASCVD risk score as well as family history of early CAD and personal history of hypertension, recommend to start statin therapy.  Patient is agreeable, start atorvastatin 10 mg daily as this is the medication that her mother took for many years and tolerated well.  In the future, may increase dose if necessary.  Orders: -     Atorvastatin Calcium; Take 1 tablet (10 mg total) by mouth daily.  Dispense: 90 tablet; Refill: 0  Essential hypertension Assessment & Plan: BP goal <130/80. Continue lisinopril 40 mg daily, hydrochlorothiazide 12.5 to 25 mg as needed for peripheral edema.  Electrolytes and kidney function within normal limits.  Will continue to monitor.  Orders: -     hydroCHLOROthiazide; Take 1 tablet (25 mg total) by mouth daily.  Dispense: 90 tablet; Refill: 1 -     Lisinopril; Take 1 tablet (40 mg total) by mouth daily.  Dispense: 90 tablet; Refill: 1    Return in about 2 months (around 04/16/2024) for follow-up for HLD, fasting labs 1 week before.     Melida Quitter, PA

## 2024-02-15 NOTE — Assessment & Plan Note (Signed)
 BP goal <130/80. Continue lisinopril 40 mg daily, hydrochlorothiazide 12.5 to 25 mg as needed for peripheral edema.  Electrolytes and kidney function within normal limits.  Will continue to monitor.

## 2024-02-21 ENCOUNTER — Encounter: Payer: 59 | Admitting: Family Medicine

## 2024-03-05 NOTE — Progress Notes (Unsigned)
 Follow Up Note  RE: Kimberly Bush MRN: [id number] DOB: 17-Apr-1961 Date of Office Visit: 03/06/2024  Referring provider: Melida Quitter, PA Primary care provider: Melida Quitter, PA  Chief Complaint: No chief complaint on file.  History of Present Illness: I had the pleasure of seeing Kimberly Bush for a follow up visit at the [facility] on 03/05/2024. She is a 63 y.o. female, who is being followed for allergic rhinoconjunctivitis and cough. Her previous allergy office visit was on 11/29/2023 with Dr. Selena Batten. Today is a regular follow up visit.  Discussed the use of AI scribe software for clinical note transcription with the patient, who gave verbal consent to proceed.  History of Present Illness            ***  Assessment and Plan: Kimberly Bush is a 63 y.o. female with: Seasonal allergic rhinitis due to pollen Allergic rhinitis due to animal dander Allergic rhinitis due to dust mite Allergic conjunctivitis of both eyes Chronic symptoms of watery eyes, runny nose, sneezing, and itchy eyes. Symptoms exacerbated by exposure to cats - 3 cats at home. Currently managed with daily Zyrtec and Benadryl.  Today's skin testing positive to trees, dust mites, cat, dog. Start environmental control measures as below. Use over the counter antihistamines such as Zyrtec (cetirizine), Claritin (loratadine), Allegra (fexofenadine), or Xyzal (levocetirizine) daily as needed. May take twice a day during allergy flares. May switch antihistamines every few months. Start Singulair (montelukast) 10mg  daily at night. Cautioned that in some children/adults can experience behavioral changes including hyperactivity, agitation, depression, sleep disturbances and suicidal ideations. These side effects are rare, but if you notice them you should notify me and discontinue Singulair (montelukast). Use Flonase (fluticasone) nasal spray 1-2 sprays per nostril once a day as needed for nasal congestion.   Nasal saline spray (i.e., Simply Saline) or nasal saline lavage (i.e., NeilMed) is recommended as needed and prior to medicated nasal sprays. Use olopatadine eye drops 0.2% once a day as needed for itchy/watery eyes. Consider allergy injections for long term control if above medications do not help the symptoms - handout given.  Let us know when ready to start.    Chronic cough Albuterol inhaler used during bronchitis in the summer. Normal spirometry today. Monitor symptoms.  If persistent, consider stopping lisinopril as that can cause a nagging cough.  Assessment and Plan              No follow-ups on file.  No orders of the defined types were placed in this encounter.  Lab Orders  No laboratory test(s) ordered today    Diagnostics: Spirometry:  Tracings reviewed. Her effort: {Blank single:19197::"Good reproducible efforts.","It was hard to get consistent efforts and there is a question as to whether this reflects a maximal maneuver.","Poor effort, data can not be interpreted."} FVC: ***L FEV1: ***L, ***% predicted FEV1/FVC ratio: ***% Interpretation: {Blank single:19197::"Spirometry consistent with mild obstructive disease","Spirometry consistent with moderate obstructive disease","Spirometry consistent with severe obstructive disease","Spirometry consistent with possible restrictive disease","Spirometry consistent with mixed obstructive and restrictive disease","Spirometry uninterpretable due to technique","Spirometry consistent with normal pattern","No overt abnormalities noted given today's efforts"}.  Please see scanned spirometry results for details.  Skin Testing: {Blank single:19197::"Select foods","Environmental allergy panel","Environmental allergy panel and select foods","Food allergy panel","None","Deferred due to recent antihistamines use"}. *** Results discussed with patient/family.   Medication List:  Current Outpatient Medications  Medication Sig Dispense  Refill  . atorvastatin (LIPITOR) 10 MG tablet Take 1 tablet (10 mg total) by  mouth daily. 90 tablet 0  . Cetirizine HCl (ZYRTEC PO) Take by mouth.    . clobetasol cream (TEMOVATE) 0.05 % Apply 1 Application topically 2 (two) times daily. 30 g 0  . diphenhydrAMINE HCl (BENADRYL PO) Take by mouth.    . fluticasone (FLONASE) 50 MCG/ACT nasal spray Place 1-2 sprays into both nostrils daily as needed (nasal congestion). 16 g 5  . hydrochlorothiazide (HYDRODIURIL) 25 MG tablet Take 1 tablet (25 mg total) by mouth daily. 90 tablet 1  . hydrocortisone 2.5 % cream Apply topically 2 (two) times daily as needed (Rash). 30 g 5  . lisinopril (ZESTRIL) 40 MG tablet Take 1 tablet (40 mg total) by mouth daily. 90 tablet 1  . montelukast (SINGULAIR) 10 MG tablet Take 1 tablet (10 mg total) by mouth at bedtime. 30 tablet 5  . omeprazole (PRILOSEC) 40 MG capsule Take 1 capsule (40 mg total) by mouth daily. 90 capsule 1  . ondansetron (ZOFRAN-ODT) 4 MG disintegrating tablet Take 1 tablet (4 mg total) by mouth every 8 (eight) hours as needed. 20 tablet 0  . triamcinolone cream (KENALOG) 0.1 % Apply 1 Application topically 2 (two) times daily. 30 g 0   No current facility-administered medications for this visit.   Allergies: Allergies  Allergen Reactions  . Prednisone     Over heating and bleeding   I reviewed her past medical history, social history, family history, and environmental history and no significant changes have been reported from her previous visit.  Review of Systems  Constitutional:  Negative for appetite change, chills, fever and unexpected weight change.  HENT:  Positive for rhinorrhea and sneezing.   Eyes:  Positive for itching.  Respiratory:  Positive for cough. Negative for chest tightness, shortness of breath and wheezing.   Cardiovascular:  Negative for chest pain.  Gastrointestinal:  Negative for abdominal pain.  Genitourinary:  Negative for difficulty urinating.  Skin:  Negative for  rash.  Allergic/Immunologic: Positive for environmental allergies.  Neurological:  Negative for headaches.   Objective: LMP  (LMP Unknown)  There is no height or weight on file to calculate BMI. Physical Exam Vitals and nursing note reviewed.  Constitutional:      Appearance: Normal appearance. She is well-developed.  HENT:     Head: Normocephalic and atraumatic.     Right Ear: Tympanic membrane and external ear normal.     Left Ear: Tympanic membrane and external ear normal.     Nose: Nose normal.     Mouth/Throat:     Mouth: Mucous membranes are moist.     Pharynx: Oropharynx is clear.  Eyes:     Conjunctiva/sclera: Conjunctivae normal.  Cardiovascular:     Rate and Rhythm: Normal rate and regular rhythm.     Heart sounds: Normal heart sounds. No murmur heard.    No friction rub. No gallop.  Pulmonary:     Effort: Pulmonary effort is normal.     Breath sounds: Normal breath sounds. No wheezing, rhonchi or rales.  Musculoskeletal:     Cervical back: Neck supple.  Skin:    General: Skin is warm.     Findings: No rash.     Comments: Dry patch on right antecubital fossa area.   Neurological:     Mental Status: She is alert and oriented to person, place, and time.  Psychiatric:        Behavior: Behavior normal.  Previous notes and tests were reviewed. The plan was reviewed with the patient/family, and  all questions/concerned were addressed.  It was my pleasure to see Kimberly Bush today and participate in her care. Please feel free to contact me with any questions or concerns.  Sincerely,  Wyline Mood, DO Allergy & Immunology  [facility] office: [phone] [facility] office: [phone]

## 2024-03-06 ENCOUNTER — Ambulatory Visit (INDEPENDENT_AMBULATORY_CARE_PROVIDER_SITE_OTHER): Payer: 59 | Admitting: Allergy

## 2024-03-06 ENCOUNTER — Encounter: Payer: Self-pay | Admitting: Allergy

## 2024-03-06 ENCOUNTER — Other Ambulatory Visit: Payer: Self-pay

## 2024-03-06 VITALS — BP 118/82 | HR 72 | Temp 98.1°F | Resp 16

## 2024-03-06 DIAGNOSIS — J3081 Allergic rhinitis due to animal (cat) (dog) hair and dander: Secondary | ICD-10-CM | POA: Diagnosis not present

## 2024-03-06 DIAGNOSIS — H1013 Acute atopic conjunctivitis, bilateral: Secondary | ICD-10-CM | POA: Diagnosis not present

## 2024-03-06 DIAGNOSIS — J3089 Other allergic rhinitis: Secondary | ICD-10-CM

## 2024-03-06 DIAGNOSIS — J301 Allergic rhinitis due to pollen: Secondary | ICD-10-CM

## 2024-03-06 DIAGNOSIS — R053 Chronic cough: Secondary | ICD-10-CM

## 2024-03-06 MED ORDER — MONTELUKAST SODIUM 10 MG PO TABS: 10.0000 mg | ORAL_TABLET | Freq: Every day | ORAL | 3 refills | Status: AC

## 2024-03-06 NOTE — Patient Instructions (Addendum)
 Environmental allergies 2024 skin testing positive to trees, dust mites, cat, dog. Continue environmental control measures. Take Zyrtec (cetirizine) 10mg  in the morning. May take an additional 1/2 tablet to 1 tablet in the evening.  Continue Singulair (montelukast) 10mg  daily at night. Use Flonase (fluticasone) nasal spray 1-2 sprays per nostril once a day as needed for nasal congestion.  Nasal saline spray (i.e., Simply Saline) or nasal saline lavage (i.e., NeilMed) is recommended as needed and prior to medicated nasal sprays. Consider allergy injections for long term control if above medications do not help the symptoms.  Coughing Monitor symptoms.   Return in about 1 year (around 03/06/2025). Or sooner if needed.

## 2024-04-25 ENCOUNTER — Other Ambulatory Visit: Payer: Self-pay | Admitting: Family Medicine

## 2024-04-25 DIAGNOSIS — K219 Gastro-esophageal reflux disease without esophagitis: Secondary | ICD-10-CM

## 2024-04-29 ENCOUNTER — Other Ambulatory Visit: Payer: Self-pay | Admitting: Family Medicine

## 2024-04-29 DIAGNOSIS — E785 Hyperlipidemia, unspecified: Secondary | ICD-10-CM

## 2024-05-22 ENCOUNTER — Other Ambulatory Visit: Payer: Self-pay | Admitting: *Deleted

## 2024-05-22 DIAGNOSIS — I10 Essential (primary) hypertension: Secondary | ICD-10-CM

## 2024-05-22 DIAGNOSIS — E785 Hyperlipidemia, unspecified: Secondary | ICD-10-CM

## 2024-05-23 ENCOUNTER — Other Ambulatory Visit

## 2024-05-23 DIAGNOSIS — I10 Essential (primary) hypertension: Secondary | ICD-10-CM

## 2024-05-23 DIAGNOSIS — E785 Hyperlipidemia, unspecified: Secondary | ICD-10-CM

## 2024-05-24 ENCOUNTER — Ambulatory Visit: Payer: Self-pay

## 2024-05-24 LAB — LIPID PANEL
Chol/HDL Ratio: 3.2 ratio (ref 0.0–4.4)
Cholesterol, Total: 123 mg/dL (ref 100–199)
HDL: 39 mg/dL — ABNORMAL LOW (ref >39)
LDL Chol Calc (NIH): 64 mg/dL (ref 0–99)
Triglycerides: 105 mg/dL (ref 0–149)
VLDL Cholesterol Cal: 20 mg/dL (ref 5–40)

## 2024-05-24 LAB — COMPREHENSIVE METABOLIC PANEL WITH GFR
ALT: 18 IU/L (ref 0–32)
AST: 22 IU/L (ref 0–40)
Albumin: 4.7 g/dL (ref 3.8–4.9)
Alkaline Phosphatase: 137 IU/L — ABNORMAL HIGH (ref 44–121)
BUN/Creatinine Ratio: 11 — ABNORMAL LOW (ref 12–28)
BUN: 9 mg/dL (ref 8–27)
Bilirubin Total: 1.6 mg/dL — ABNORMAL HIGH (ref 0.0–1.2)
CO2: 21 mmol/L (ref 20–29)
Calcium: 9.5 mg/dL (ref 8.7–10.3)
Chloride: 104 mmol/L (ref 96–106)
Creatinine, Ser: 0.84 mg/dL (ref 0.57–1.00)
Globulin, Total: 1.9 g/dL (ref 1.5–4.5)
Glucose: 110 mg/dL — ABNORMAL HIGH (ref 70–99)
Potassium: 3.8 mmol/L (ref 3.5–5.2)
Sodium: 141 mmol/L (ref 134–144)
Total Protein: 6.6 g/dL (ref 6.0–8.5)
eGFR: 80 mL/min/{1.73_m2} (ref >59)

## 2024-05-29 ENCOUNTER — Ambulatory Visit (INDEPENDENT_AMBULATORY_CARE_PROVIDER_SITE_OTHER)

## 2024-05-29 VITALS — BP 119/79 | HR 64 | Ht 61.0 in | Wt 159.1 lb

## 2024-05-29 DIAGNOSIS — E785 Hyperlipidemia, unspecified: Secondary | ICD-10-CM

## 2024-05-29 NOTE — Assessment & Plan Note (Signed)
 Last lipid panel: LDL 64, HDL 39, triglycerides 105.  Improved on atorvastatin  10 mg daily.  Alk phos slightly elevated at 137 with no previous elevations noted.  Discussed with patient that this elevation is likely due to the initiation of the statin.  Will plan to recheck lipid panel and CMP in 6 months.

## 2024-05-29 NOTE — Patient Instructions (Signed)
 It was nice to see you today!  As we discussed in clinic: -Continue your atorvastatin  10 mg as prescribed.  -We will plan to recheck your lipid panel and liver function in 6 months!   It was nice to meet you!  If you have any problems before your next visit feel free to message me via MyChart (minor issues or questions) or call the office, otherwise you may reach out to schedule an office visit.  Thank you! Meryl Acosta, PA-C

## 2024-05-29 NOTE — Progress Notes (Signed)
 Established Patient Office Visit  Subjective   Patient ID: Kimberly Bush, female    DOB: 05/03/1961  Age: 63 y.o. MRN: [id number]  Chief Complaint  Patient presents with   Hyperlipidemia    HPI  Kimberly Bush is a 63 year old female who presents to the clinic today for follow-up on hyperlipidemia.  At her last visit with Britta Candy in March, she was started on atorvastatin  10 mg daily.  Patient reports excellent compliance with this medication and denies side effects/new myalgias.      ROS Per HPI.    Objective:     BP 119/79   Pulse 64   Ht 5' 1 (1.549 m)   Wt 159 lb 1.3 oz (72.2 kg)   LMP  (LMP Unknown)   SpO2 98%   BMI 30.06 kg/m    Physical Exam Constitutional:      General: She is not in acute distress.    Appearance: Normal appearance.   Cardiovascular:     Rate and Rhythm: Normal rate and regular rhythm.     Heart sounds: Normal heart sounds. No murmur heard.    No friction rub. No gallop.  Pulmonary:     Effort: Pulmonary effort is normal. No respiratory distress.     Breath sounds: Normal breath sounds.   Musculoskeletal:        General: No swelling.   Skin:    General: Skin is warm and dry.   Neurological:     General: No focal deficit present.     Mental Status: She is alert.   Psychiatric:        Mood and Affect: Mood normal.        Behavior: Behavior normal.        Thought Content: Thought content normal.    No results found for any visits on 05/29/24.  Last CBC Lab Results  Component Value Date   WBC 4.9 02/08/2024   HGB 13.4 02/08/2024   HCT 39.4 02/08/2024   MCV 94 02/08/2024   MCH 31.9 02/08/2024   RDW 12.8 02/08/2024   PLT 180 02/08/2024   Last metabolic panel Lab Results  Component Value Date   GLUCOSE 110 (H) 05/23/2024   NA 141 05/23/2024   K 3.8 05/23/2024   CL 104 05/23/2024   CO2 21 05/23/2024   BUN 9 05/23/2024   CREATININE 0.84 05/23/2024   EGFR 80 05/23/2024   CALCIUM  9.5 05/23/2024   PROT 6.6 05/23/2024    ALBUMIN 4.7 05/23/2024   LABGLOB 1.9 05/23/2024   AGRATIO 2.2 02/13/2022   BILITOT 1.6 (H) 05/23/2024   ALKPHOS 137 (H) 05/23/2024   AST 22 05/23/2024   ALT 18 05/23/2024   Last lipids Lab Results  Component Value Date   CHOL 123 05/23/2024   HDL 39 (L) 05/23/2024   LDLCALC 64 05/23/2024   TRIG 105 05/23/2024   CHOLHDL 3.2 05/23/2024   Last hemoglobin A1c Lab Results  Component Value Date   HGBA1C 5.5 02/08/2024   Last thyroid functions Lab Results  Component Value Date   TSH 2.130 02/08/2024      The ASCVD Risk score (Arnett DK, et al., 2019) failed to calculate for the following reasons:   The valid total cholesterol range is 130 to 320 mg/dL    Assessment & Plan:   Hyperlipidemia LDL goal <100 Assessment & Plan: Last lipid panel: LDL 64, HDL 39, triglycerides 105.  Improved on atorvastatin  10 mg daily.  Alk phos slightly elevated at 137 with no  previous elevations noted.  Discussed with patient that this elevation is likely due to the initiation of the statin.  Will plan to recheck lipid panel and CMP in 6 months.     Return in about 6 months (around 11/28/2024) for HTN, HLD.    Odilia Bennett, PA-C

## 2024-05-31 ENCOUNTER — Other Ambulatory Visit: Payer: Self-pay | Admitting: Allergy

## 2024-06-13 ENCOUNTER — Other Ambulatory Visit: Payer: Self-pay

## 2024-06-13 DIAGNOSIS — Z1231 Encounter for screening mammogram for malignant neoplasm of breast: Secondary | ICD-10-CM

## 2024-08-02 ENCOUNTER — Ambulatory Visit

## 2024-08-02 DIAGNOSIS — Z1231 Encounter for screening mammogram for malignant neoplasm of breast: Secondary | ICD-10-CM

## 2024-08-09 ENCOUNTER — Other Ambulatory Visit: Payer: Self-pay | Admitting: *Deleted

## 2024-08-09 DIAGNOSIS — E785 Hyperlipidemia, unspecified: Secondary | ICD-10-CM

## 2024-08-09 MED ORDER — ATORVASTATIN CALCIUM 10 MG PO TABS: 10.0000 mg | ORAL_TABLET | Freq: Every day | ORAL | 0 refills | Status: AC

## 2024-08-09 MED ORDER — ATORVASTATIN CALCIUM 10 MG PO TABS: 10.0000 mg | ORAL_TABLET | Freq: Every day | ORAL | 0 refills | Status: DC

## 2024-08-10 ENCOUNTER — Other Ambulatory Visit: Payer: Self-pay

## 2024-08-10 DIAGNOSIS — I10 Essential (primary) hypertension: Secondary | ICD-10-CM

## 2024-08-10 MED ORDER — LISINOPRIL 40 MG PO TABS: 40.0000 mg | ORAL_TABLET | Freq: Every day | ORAL | 1 refills | Status: AC

## 2024-08-10 NOTE — Telephone Encounter (Signed)
 Copied from CRM [id number]. Topic: Clinical - Medication Refill >> Aug 10, 2024 10:05 AM Rosaria E wrote: Medication:  lisinopril  (ZESTRIL ) 40 MG tablet  Has the patient contacted their pharmacy? Yes (Agent: If no, request that the patient contact the pharmacy for the refill. If patient does not wish to contact the pharmacy document the reason why and proceed with request.) (Agent: If yes, when and what did the pharmacy advise?)  This is the patient's preferred pharmacy:   [facility] [id number] [place], [place] - [address] [place] [address] Phone: [phone] Fax: [phone]   Is this the correct pharmacy for this prescription? Yes If no, delete pharmacy and type the correct one.   Has the prescription been filled recently? Yes  Is the patient out of the medication? Yes  Has the patient been seen for an appointment in the last year OR does the patient have an upcoming appointment? Yes  Can we respond through MyChart? Yes  Agent: Please be advised that Rx refills may take up to 3 business days. We ask that you follow-up with your pharmacy.

## 2024-09-20 ENCOUNTER — Ambulatory Visit (HOSPITAL_COMMUNITY)
Admission: EM | Admit: 2024-09-20 | Discharge: 2024-09-20 | Disposition: A | Discharge status: Home / Self Care | Attending: Emergency Medicine | Admitting: Emergency Medicine

## 2024-09-20 ENCOUNTER — Encounter (HOSPITAL_COMMUNITY): Payer: Self-pay

## 2024-09-20 DIAGNOSIS — R10A2 Flank pain, left side: Secondary | ICD-10-CM | POA: Diagnosis present

## 2024-09-20 LAB — POCT URINALYSIS DIP (MANUAL ENTRY)
Bilirubin, UA: NEGATIVE
Blood, UA: NEGATIVE
Glucose, UA: NEGATIVE mg/dL
Ketones, POC UA: NEGATIVE mg/dL
Leukocytes, UA: NEGATIVE
Nitrite, UA: NEGATIVE
Protein Ur, POC: NEGATIVE mg/dL
Spec Grav, UA: >=1.03 — AB (ref 1.010–1.025)
Urobilinogen, UA: 0.2 U/dL
pH, UA: 5.5 (ref 5.0–8.0)

## 2024-09-20 MED ORDER — ONDANSETRON 8 MG PO TBDP: 8.0000 mg | ORAL_TABLET | Freq: Three times a day (TID) | ORAL | 0 refills | Status: AC | PRN

## 2024-09-20 MED ORDER — TAMSULOSIN HCL 0.4 MG PO CAPS: 0.4000 mg | ORAL_CAPSULE | Freq: Every day | ORAL | 0 refills | Status: AC

## 2024-09-20 MED ORDER — HYDROCODONE-ACETAMINOPHEN 5-325 MG PO TABS: 1.0000 | ORAL_TABLET | ORAL | 0 refills | Status: AC | PRN

## 2024-09-20 NOTE — Discharge Instructions (Addendum)
 Take the Flomax daily with dinner. Strain all urine. Drink at least 64 ounces of water daily.  You can use the nausea medicine every 8 hours as needed.  Use the Norco as needed for severe pain relief, this is a narcotic so it may cause nausea, vomiting, drowsiness or constipation.  Use it sparingly.  I suggest following up with urology.  It is a good idea to cut back on your sodas, tea and milk as most kidney stones are calcium  and this can lead to increased kidney stone formation.  We are sending your urine off for culture and we will contact you if antibiotics are needed.  Seek immediate care at the nearest emergency department if you develop severe pain, fever, inability to urinate, or new concerning symptoms.

## 2024-09-20 NOTE — ED Notes (Signed)
 Patient given a urine strainer, urine collection hat and cup upon discharge.

## 2024-09-20 NOTE — ED Provider Notes (Signed)
 [facility]    CSN: [id number] Arrival date & time: 09/20/24  1259      History   Chief Complaint Chief Complaint  Patient presents with   Abdominal Pain    HPI Kimberly Bush is a 63 y.o. female.   Patient presents to clinic over concerns of left flank pain for the past 2 days.  She is not having any abdominal pain, denies dysuria, urgency or frequency.  Has not had hematuria.  Has taken Aleve without improvement.  History of kidney stones years ago, had lithotripsy and had not had any further issues.  Felt like she was having a UTI with the flank pain so she decided to come into clinic.  Has had some nausea, no vomiting.  Without fevers.  The history is provided by the patient and medical records.  Abdominal Pain   Past Medical History:  Diagnosis Date   Hypertension 05/14/21   Seasonal allergies     Patient Active Problem List   Diagnosis Date Noted   Hyperlipidemia LDL goal <100 04/05/2022   Gastroesophageal reflux disease without esophagitis 02/21/2022   Essential hypertension 01/16/2022   Body mass index 29.0-29.9, adult 01/16/2022   Family history of early CAD 01/16/2022    Past Surgical History:  Procedure Laterality Date   BACK SURGERY      OB History   No obstetric history on file.      Home Medications    Prior to Admission medications   Medication Sig Start Date End Date Taking? Authorizing Provider  HYDROcodone-acetaminophen (NORCO/VICODIN) 5-325 MG tablet Take 1 tablet by mouth every 4 (four) hours as needed. 09/20/24  Yes Dudley Mages  N, FNP  ondansetron  (ZOFRAN -ODT) 8 MG disintegrating tablet Take 1 tablet (8 mg total) by mouth every 8 (eight) hours as needed. 09/20/24  Yes Dreama, Chryl Holten  N, FNP  tamsulosin (FLOMAX) 0.4 MG CAPS capsule Take 1 capsule (0.4 mg total) by mouth daily after supper. 09/20/24  Yes Brenlynn Fake  N, FNP  atorvastatin  (LIPITOR) 10 MG tablet Take 1 tablet (10 mg total) by mouth daily. 08/09/24   Gayle Saddie FALCON, PA-C  Cetirizine HCl (ZYRTEC PO) Take by mouth.    [provider]  clobetasol  cream (TEMOVATE ) 0.05 % Apply 1 Application topically 2 (two) times daily. 09/09/23   Alm Delon SAILOR, DO  fluticasone  (FLONASE ) 50 MCG/ACT nasal spray PLACE 1-2 SPRAYS INTO BOTH NOSTRILS DAILY AS NEEDED (NASAL CONGESTION). 05/31/24   Luke Orlan HERO, DO  hydrochlorothiazide  (HYDRODIURIL ) 25 MG tablet Take 1 tablet (25 mg total) by mouth daily. 02/15/24   Wallace Joesph LABOR, PA  hydrocortisone  2.5 % cream Apply topically 2 (two) times daily as needed (Rash). 09/09/23   Alm Delon SAILOR, DO  lisinopril  (ZESTRIL ) 40 MG tablet Take 1 tablet (40 mg total) by mouth daily. 08/10/24   Gayle Saddie FALCON, PA-C  montelukast  (SINGULAIR ) 10 MG tablet Take 1 tablet (10 mg total) by mouth at bedtime. 03/06/24   Luke Orlan HERO, DO  omeprazole  (PRILOSEC) 40 MG capsule TAKE 1 CAPSULE BY MOUTH DAILY 04/25/24   Chandra Toribio POUR, MD  triamcinolone  cream (KENALOG ) 0.1 % Apply 1 Application topically 2 (two) times daily. 07/14/23   Hazen Darryle BRAVO, FNP    Family History Family History  Problem Relation Age of Onset   Hypertension Mother    Hyperlipidemia Mother    Stroke Mother    Heart attack Mother    Heart disease Mother    Eczema Nephew    Allergic  rhinitis Neg Hx    Angioedema Neg Hx    Asthma Neg Hx    Urticaria Neg Hx     Social History Social History   Tobacco Use   Smoking status: Never    Passive exposure: Never   Smokeless tobacco: Never  Vaping Use   Vaping status: Never Used  Substance Use Topics   Alcohol use: Not Currently    Comment: very occasionally   Drug use: No     Allergies   Prednisone   Review of Systems Review of Systems  Per HPI  Physical Exam Triage Vital Signs ED Triage Vitals  Encounter Vitals Group     BP 09/20/24 1352 (!) 103/59     Girls Systolic BP Percentile --      Girls Diastolic BP Percentile --      Boys Systolic BP Percentile --      Boys Diastolic BP Percentile --       Pulse Rate 09/20/24 1352 62     Resp 09/20/24 1352 16     Temp 09/20/24 1352 98.1 F (36.7 C)     Temp Source 09/20/24 1352 Oral     SpO2 09/20/24 1352 98 %     Weight --      Height --      Head Circumference --      Peak Flow --      Pain Score 09/20/24 1351 6     Pain Loc --      Pain Education --      Exclude from Growth Chart --    No data found.  Updated Vital Signs BP (!) 103/59 (BP Location: Left Arm)   Pulse 62   Temp 98.1 F (36.7 C) (Oral)   Resp 16   LMP  (LMP Unknown)   SpO2 98%   Visual Acuity Right Eye Distance:   Left Eye Distance:   Bilateral Distance:    Right Eye Near:   Left Eye Near:    Bilateral Near:     Physical Exam Vitals and nursing note reviewed.  Constitutional:      Appearance: She is well-developed.  HENT:     Head: Normocephalic and atraumatic.     Mouth/Throat:     Mouth: Mucous membranes are moist.  Eyes:     General: No scleral icterus. Cardiovascular:     Rate and Rhythm: Normal rate.  Pulmonary:     Effort: Pulmonary effort is normal. No respiratory distress.  Abdominal:     Tenderness: There is left CVA tenderness. There is no right CVA tenderness.  Skin:    General: Skin is warm and dry.  Neurological:     General: No focal deficit present.     Mental Status: She is alert.  Psychiatric:        Mood and Affect: Mood normal.      UC Treatments / Results  Labs (all labs ordered are listed, but only abnormal results are displayed) Labs Reviewed  POCT URINALYSIS DIP (MANUAL ENTRY) - Abnormal; Notable for the following components:      Result Value   Clarity, UA cloudy (*)    Spec Grav, UA >=1.030 (*)    All other components within normal limits  URINE CULTURE    EKG   Radiology No results found.  Procedures Procedures (including critical care time)  Medications Ordered in UC Medications - No data to display  Initial Impression / Assessment and Plan / UC Course  I have  reviewed the triage  vital signs and the nursing notes.  Pertinent labs & imaging results that were available during my care of the patient were reviewed by me and considered in my medical decision making (see chart for details).  Vitals and triage reviewed, patient is hemodynamically stable.  UA without evidence of UTI.  Without blood.  Positive for left-sided CVA tenderness.  Without abdominal pain.  Suspect early nephrolithiasis.  Discussed limitations of urgent care and limited imaging available.  Encouraged urology follow-up.  Did offer IM Toradol , patient reports this does not work for her and only Demerol works.  Since this medication is not available urgent care, will trial Norco sparingly as well as Flomax and Zofran .  Strainer provided.  Plan of care, follow-up care and strict emergency precautions given if symptoms evolve, no questions at this time.    Final Clinical Impressions(s) / UC Diagnoses   Final diagnoses:  Left flank pain     Discharge Instructions      Take the Flomax daily with dinner. Strain all urine. Drink at least 64 ounces of water daily.  You can use the nausea medicine every 8 hours as needed.  Use the Norco as needed for severe pain relief, this is a narcotic so it may cause nausea, vomiting, drowsiness or constipation.  Use it sparingly.  I suggest following up with urology.  It is a good idea to cut back on your sodas, tea and milk as most kidney stones are calcium  and this can lead to increased kidney stone formation.  We are sending your urine off for culture and we will contact you if antibiotics are needed.  Seek immediate care at the nearest emergency department if you develop severe pain, fever, inability to urinate, or new concerning symptoms.     ED Prescriptions     Medication Sig Dispense Auth. Provider   tamsulosin (FLOMAX) 0.4 MG CAPS capsule Take 1 capsule (0.4 mg total) by mouth daily after supper. 30 capsule Dreama, Zerah Hilyer  N, FNP   ondansetron   (ZOFRAN -ODT) 8 MG disintegrating tablet Take 1 tablet (8 mg total) by mouth every 8 (eight) hours as needed. 20 tablet Dreama, Lashaun Poch  N, FNP   HYDROcodone-acetaminophen (NORCO/VICODIN) 5-325 MG tablet Take 1 tablet by mouth every 4 (four) hours as needed. 10 tablet Dreama, Ariaunna Longsworth  N, FNP      I have reviewed the PDMP during this encounter.   Dreama, Christiana Gurevich  N, FNP 09/20/24 1445

## 2024-09-20 NOTE — ED Triage Notes (Signed)
 Patient here today with c/o low abd pain X 2 days. She has taken Aleve with no relief.

## 2024-09-21 LAB — URINE CULTURE

## 2024-09-22 ENCOUNTER — Ambulatory Visit (HOSPITAL_COMMUNITY): Payer: Self-pay

## 2024-09-22 ENCOUNTER — Telehealth: Payer: Self-pay

## 2024-09-22 NOTE — Telephone Encounter (Signed)
 FYI Dr Luke [place]ceived [place] from  [facility] [address] patient received influenza  .50 on 09/20/2024 IM right deltoid  lot [id number] expiration 06/07/2024

## 2024-09-24 NOTE — Telephone Encounter (Signed)
 Noted

## 2024-11-14 ENCOUNTER — Other Ambulatory Visit: Payer: Self-pay | Admitting: *Deleted

## 2024-11-14 DIAGNOSIS — K219 Gastro-esophageal reflux disease without esophagitis: Secondary | ICD-10-CM

## 2024-11-14 MED ORDER — OMEPRAZOLE 40 MG PO CPDR: 40.0000 mg | DELAYED_RELEASE_CAPSULE | Freq: Every day | ORAL | 1 refills | Status: DC

## 2024-11-15 ENCOUNTER — Other Ambulatory Visit: Payer: Self-pay

## 2024-11-15 DIAGNOSIS — K219 Gastro-esophageal reflux disease without esophagitis: Secondary | ICD-10-CM

## 2024-11-15 MED ORDER — OMEPRAZOLE 40 MG PO CPDR: 40.0000 mg | DELAYED_RELEASE_CAPSULE | Freq: Every day | ORAL | 1 refills | Status: AC

## 2024-11-16 ENCOUNTER — Other Ambulatory Visit: Payer: Self-pay

## 2024-11-16 DIAGNOSIS — E785 Hyperlipidemia, unspecified: Secondary | ICD-10-CM

## 2024-11-22 ENCOUNTER — Other Ambulatory Visit

## 2024-11-22 DIAGNOSIS — E785 Hyperlipidemia, unspecified: Secondary | ICD-10-CM

## 2024-11-23 LAB — LIPID PANEL
Chol/HDL Ratio: 3.3 ratio (ref 0.0–4.4)
Cholesterol, Total: 130 mg/dL (ref 100–199)
HDL: 40 mg/dL (ref >39)
LDL Chol Calc (NIH): 72 mg/dL (ref 0–99)
Triglycerides: 97 mg/dL (ref 0–149)
VLDL Cholesterol Cal: 18 mg/dL (ref 5–40)

## 2024-11-23 LAB — COMPREHENSIVE METABOLIC PANEL WITH GFR
ALT: 14 IU/L (ref 0–32)
AST: 17 IU/L (ref 0–40)
Albumin: 4.7 g/dL (ref 3.9–4.9)
Alkaline Phosphatase: 117 IU/L (ref 49–135)
BUN/Creatinine Ratio: 10 — ABNORMAL LOW (ref 12–28)
BUN: 8 mg/dL (ref 8–27)
Bilirubin Total: 1.4 mg/dL — ABNORMAL HIGH (ref 0.0–1.2)
CO2: 25 mmol/L (ref 20–29)
Calcium: 9.5 mg/dL (ref 8.7–10.3)
Chloride: 104 mmol/L (ref 96–106)
Creatinine, Ser: 0.82 mg/dL (ref 0.57–1.00)
Globulin, Total: 1.9 g/dL (ref 1.5–4.5)
Glucose: 98 mg/dL (ref 70–99)
Potassium: 4 mmol/L (ref 3.5–5.2)
Sodium: 143 mmol/L (ref 134–144)
Total Protein: 6.6 g/dL (ref 6.0–8.5)
eGFR: 81 mL/min/1.73 (ref >59)

## 2024-11-26 ENCOUNTER — Ambulatory Visit: Payer: Self-pay

## 2024-11-29 ENCOUNTER — Ambulatory Visit

## 2025-01-03 ENCOUNTER — Ambulatory Visit

## 2025-02-07 ENCOUNTER — Ambulatory Visit

## 2025-03-07 ENCOUNTER — Ambulatory Visit: Admitting: Allergy
